# Patient Record
Sex: Female | Born: 1980 | Race: White | Hispanic: No | Smoking: Current every day smoker
Health system: Southern US, Community
[De-identification: ages and names within clinical notes are randomized; demographics above are authoritative.]

## PROBLEM LIST (undated history)

## (undated) DIAGNOSIS — M419 Scoliosis, unspecified: Secondary | ICD-10-CM

## (undated) DIAGNOSIS — F419 Anxiety disorder, unspecified: Secondary | ICD-10-CM

## (undated) DIAGNOSIS — N809 Endometriosis, unspecified: Secondary | ICD-10-CM

## (undated) DIAGNOSIS — Z8619 Personal history of other infectious and parasitic diseases: Secondary | ICD-10-CM

## (undated) DIAGNOSIS — I319 Disease of pericardium, unspecified: Secondary | ICD-10-CM

## (undated) DIAGNOSIS — F32A Depression, unspecified: Secondary | ICD-10-CM

## (undated) DIAGNOSIS — Z8711 Personal history of peptic ulcer disease: Secondary | ICD-10-CM

## (undated) HISTORY — DX: Personal history of other infectious and parasitic diseases: Z86.19

## (undated) HISTORY — DX: Endometriosis, unspecified: N80.9

## (undated) HISTORY — PX: CARDIAC CATHETERIZATION: SHX172

## (undated) HISTORY — PX: TUBAL LIGATION: SHX77

## (undated) HISTORY — DX: Disease of pericardium, unspecified: I31.9

## (undated) HISTORY — PX: ABDOMINAL HYSTERECTOMY: SHX81

---

## 2004-04-25 ENCOUNTER — Emergency Department (HOSPITAL_COMMUNITY): Admission: EM | Admit: 2004-04-25 | Discharge: 2004-04-26 | Payer: Self-pay | Admitting: *Deleted

## 2006-03-01 ENCOUNTER — Emergency Department (HOSPITAL_COMMUNITY): Admission: EM | Admit: 2006-03-01 | Discharge: 2006-03-01 | Payer: Self-pay | Admitting: Emergency Medicine

## 2008-02-16 ENCOUNTER — Emergency Department (HOSPITAL_COMMUNITY): Admission: EM | Admit: 2008-02-16 | Discharge: 2008-02-17 | Payer: Self-pay | Admitting: Emergency Medicine

## 2008-03-28 ENCOUNTER — Emergency Department (HOSPITAL_COMMUNITY): Admission: EM | Admit: 2008-03-28 | Discharge: 2008-03-28 | Payer: Self-pay | Admitting: Emergency Medicine

## 2009-01-23 ENCOUNTER — Emergency Department (HOSPITAL_COMMUNITY): Admission: EM | Admit: 2009-01-23 | Discharge: 2009-01-23 | Payer: Self-pay | Admitting: Emergency Medicine

## 2010-02-18 ENCOUNTER — Emergency Department (HOSPITAL_COMMUNITY): Admission: EM | Admit: 2010-02-18 | Discharge: 2010-02-18 | Payer: Self-pay | Admitting: Emergency Medicine

## 2010-02-19 ENCOUNTER — Ambulatory Visit (HOSPITAL_COMMUNITY): Admission: RE | Admit: 2010-02-19 | Discharge: 2010-02-19 | Payer: Self-pay | Admitting: Emergency Medicine

## 2010-04-20 ENCOUNTER — Emergency Department (HOSPITAL_COMMUNITY): Admission: EM | Admit: 2010-04-20 | Discharge: 2010-04-21 | Payer: Self-pay | Admitting: Emergency Medicine

## 2010-09-05 ENCOUNTER — Emergency Department (HOSPITAL_COMMUNITY): Admission: EM | Admit: 2010-09-05 | Discharge: 2010-09-06 | Payer: Self-pay | Admitting: Emergency Medicine

## 2010-09-06 ENCOUNTER — Emergency Department (HOSPITAL_COMMUNITY): Admission: EM | Admit: 2010-09-06 | Discharge: 2010-09-07 | Payer: Self-pay | Admitting: Emergency Medicine

## 2011-03-12 LAB — BASIC METABOLIC PANEL
BUN: 11 mg/dL (ref 6–23)
CO2: 23 mEq/L (ref 19–32)
Calcium: 8.7 mg/dL (ref 8.4–10.5)
GFR calc Af Amer: >60 mL/min (ref >60)
GFR calc non Af Amer: >60 mL/min (ref >60)
Glucose, Bld: 121 mg/dL — ABNORMAL HIGH (ref 70–99)
Sodium: 134 mEq/L — ABNORMAL LOW (ref 135–145)

## 2011-03-12 LAB — CBC
HCT: 36.4 % (ref 36.0–46.0)
Hemoglobin: 12.4 g/dL (ref 12.0–15.0)
MCH: 29.4 pg (ref 26.0–34.0)
MCHC: 34 g/dL (ref 30.0–36.0)
Platelets: 230 10*3/uL (ref 150–400)
RDW: 12.5 % (ref 11.5–15.5)

## 2011-03-12 LAB — URINE MICROSCOPIC-ADD ON

## 2011-03-12 LAB — URINALYSIS, ROUTINE W REFLEX MICROSCOPIC
Bilirubin Urine: NEGATIVE
Glucose, UA: NEGATIVE mg/dL
Ketones, ur: NEGATIVE mg/dL
Leukocytes, UA: NEGATIVE
Nitrite: NEGATIVE
Protein, ur: NEGATIVE mg/dL
Urobilinogen, UA: 0.2 mg/dL (ref 0.0–1.0)

## 2011-03-12 LAB — DIFFERENTIAL
Basophils Relative: 0 % (ref 0–1)
Lymphocytes Relative: 13 % (ref 12–46)
Lymphs Abs: 1.4 10*3/uL (ref 0.7–4.0)
Neutro Abs: 8.6 10*3/uL — ABNORMAL HIGH (ref 1.7–7.7)
Neutrophils Relative %: 80 % — ABNORMAL HIGH (ref 43–77)

## 2011-03-18 LAB — URINALYSIS, ROUTINE W REFLEX MICROSCOPIC
Leukocytes, UA: NEGATIVE
Nitrite: NEGATIVE
Protein, ur: NEGATIVE mg/dL
Urobilinogen, UA: 0.2 mg/dL (ref 0.0–1.0)

## 2011-03-18 LAB — URINE MICROSCOPIC-ADD ON

## 2011-03-18 LAB — WET PREP, GENITAL
Trich, Wet Prep: NONE SEEN
Yeast Wet Prep HPF POC: NONE SEEN

## 2011-03-18 LAB — PREGNANCY, URINE: Preg Test, Ur: NEGATIVE

## 2011-03-18 LAB — RPR: RPR Ser Ql: NONREACTIVE

## 2011-04-13 LAB — URINALYSIS, ROUTINE W REFLEX MICROSCOPIC
Ketones, ur: NEGATIVE mg/dL
Leukocytes, UA: NEGATIVE
Nitrite: NEGATIVE
Protein, ur: NEGATIVE mg/dL
Urobilinogen, UA: 0.2 mg/dL (ref 0.0–1.0)

## 2011-04-13 LAB — URINE MICROSCOPIC-ADD ON

## 2011-04-13 LAB — GC/CHLAMYDIA PROBE AMP, GENITAL
Chlamydia, DNA Probe: NEGATIVE
GC Probe Amp, Genital: NEGATIVE

## 2011-04-13 LAB — WET PREP, GENITAL

## 2011-04-13 LAB — RPR: RPR Ser Ql: NONREACTIVE

## 2011-06-13 ENCOUNTER — Emergency Department (HOSPITAL_COMMUNITY)
Admission: EM | Admit: 2011-06-13 | Discharge: 2011-06-13 | Disposition: A | Discharge status: Home / Self Care | Payer: Medicaid Other | Attending: Emergency Medicine | Admitting: Emergency Medicine

## 2011-06-13 ENCOUNTER — Emergency Department (HOSPITAL_COMMUNITY): Payer: Medicaid Other

## 2011-06-13 DIAGNOSIS — G43109 Migraine with aura, not intractable, without status migrainosus: Secondary | ICD-10-CM | POA: Insufficient documentation

## 2011-06-13 DIAGNOSIS — R112 Nausea with vomiting, unspecified: Secondary | ICD-10-CM | POA: Insufficient documentation

## 2011-06-13 DIAGNOSIS — R29898 Other symptoms and signs involving the musculoskeletal system: Secondary | ICD-10-CM | POA: Insufficient documentation

## 2011-06-13 DIAGNOSIS — R4701 Aphasia: Secondary | ICD-10-CM | POA: Insufficient documentation

## 2011-06-13 DIAGNOSIS — R079 Chest pain, unspecified: Secondary | ICD-10-CM | POA: Insufficient documentation

## 2011-06-13 LAB — COMPREHENSIVE METABOLIC PANEL
AST: 25 U/L (ref 0–37)
Albumin: 3.8 g/dL (ref 3.5–5.2)
BUN: 11 mg/dL (ref 6–23)
CO2: 23 mEq/L (ref 19–32)
Calcium: 8.8 mg/dL (ref 8.4–10.5)
Chloride: 103 mEq/L (ref 96–112)
Creatinine, Ser: 0.75 mg/dL (ref 0.50–1.10)
GFR calc non Af Amer: >60 mL/min (ref >60)
Total Bilirubin: 0.2 mg/dL — ABNORMAL LOW (ref 0.3–1.2)

## 2011-06-13 LAB — CBC
HCT: 36.9 % (ref 36.0–46.0)
MCH: 28.7 pg (ref 26.0–34.0)
MCV: 83.3 fL (ref 78.0–100.0)
Platelets: 269 10*3/uL (ref 150–400)
RDW: 12.5 % (ref 11.5–15.5)
WBC: 8.4 10*3/uL (ref 4.0–10.5)

## 2011-06-13 LAB — POCT I-STAT, CHEM 8
BUN: 13 mg/dL (ref 6–23)
Calcium, Ion: 1.1 mmol/L — ABNORMAL LOW (ref 1.12–1.32)
Chloride: 106 mEq/L (ref 96–112)
Creatinine, Ser: 0.8 mg/dL (ref 0.50–1.10)
Glucose, Bld: 103 mg/dL — ABNORMAL HIGH (ref 70–99)
Potassium: 4.4 mEq/L (ref 3.5–5.1)

## 2011-06-13 LAB — DIFFERENTIAL
Eosinophils Absolute: 0.1 10*3/uL (ref 0.0–0.7)
Eosinophils Relative: 1 % (ref 0–5)
Lymphocytes Relative: 23 % (ref 12–46)
Lymphs Abs: 1.9 10*3/uL (ref 0.7–4.0)
Monocytes Relative: 7 % (ref 3–12)

## 2011-06-13 LAB — CK TOTAL AND CKMB (NOT AT ARMC)
CK, MB: 1.7 ng/mL (ref 0.3–4.0)
Relative Index: 1.3 (ref 0.0–2.5)
Total CK: 132 U/L (ref 7–177)

## 2011-06-14 NOTE — Consult Note (Signed)
NAMESEARA, Desiree Pope               ACCOUNT NO.:  [id number]  MEDICAL RECORD NO.:  [id number]  LOCATION:  [facility]                         FACILITY:  [facility]  PHYSICIAN:  Levie Heritage, MD       DATE OF BIRTH:  April 16, 1981  DATE OF CONSULTATION:  06/13/2011 DATE OF DISCHARGE:  06/13/2011                                CONSULTATION   REFERRING PHYSICIAN:  ER Team.  REASON FOR CONSULTATION:  Code stroke.  CHIEF COMPLAINT:  Problem speaking and questionable right-sided weakness.  HISTORY OF PRESENT ILLNESS:  This patient is a 30 year old woman who was in her usual state of health around 10:30 a.m. this morning when husband called the EMS for chest pain originally.  When the EMS arrived there, she still had some degree of chest pain, however main complaint changed to problems with slowing of speaking and headaches and some questionable right-sided weakness that was defined by the EMS as partial weakness, but not fully weak on the right side. On my examination in the emergency department at around 1:20 p.m. today, her NIH stroke scale is 0.  I do see the speaking but she is just speaking a little slow.  There is no dysarthria or aphasia otherwise. CT scan of the head was performed in the emergency department and was unremarkable for any acute intracranial abnormality either.  PAST MEDICAL HISTORY:  The patient is known to have migraine headaches, spina bifida and scoliosis.  MEDICATIONS:  She uses p.r.n. basis of Excedrin for the migraine headaches.  ALLERGIES:  She is allergic to [facility].  SOCIAL HISTORY:  She is married, has 2 children.  Quit smoking a year ago.  Rarely use alcohol, but denies drug abuse.  FAMILY HISTORY:  Grandmother had diabetes mellitus, hypertension, and cardiac issues.  Mother has breast cancer.  Father has migraine and obstructive sleep apnea.  REVIEW OF CLINICAL DATA:  I have reviewed the patient's CT scan of the head performed today, it is unremarkable.   I have seen her stroke panel, it is unremarkable as well.  REVIEW OF SYSTEMS:  Currently, she admits to have headache but denies any chest pain and shortness of breath, although she states that she did have this early morning.  No recent fevers.  No recent rashes.  No recent weight loss.  She does admit to have "lot of stressors around me."  On further questioning, she admits to have the main issue because the husband is not home and works out of the town somewhere and she is homebound with 2 children. No bowel or bladder control problem.  No nausea, vomiting, diarrhea.  No fevers.  No burning urination.  PHYSICAL EXAMINATION:  Comfortably lying down on the bed now.  Vitals, 89 per minute pulse and blood pressure of 134/78 mmHg. She is awake, oriented x3 in mild distress of overall anxiety. She is speaking slowly and states that it is because of having severe headaches and she is trying to avoid her head to move, even the jaw hurts when she speaks louder normally. Both pupils are reactive to light and accommodation.  I do not see any field cut.  Moves eyes to all direction.  Face  is symmetrical for strength testing and sensation.  Tongue is midline without atrophy or fasciculation.  Palate elevates symmetrically bilaterally. Motor examination reveals strength 5/5 all over without a drift. Sensory examination is intact to light touch all over. Gait, I have seen her walking normally and casually from bed to the restroom in the ER. She is not aphasic and is not dysarthric either.  She is just speaking slowly and moving slowly in order to avoid the headache.  IMPRESSION:  A 30 year old woman with known long history of migraine, has no headache as well as questionable transient right-sided weakness that we did not notice or witnessed in the ER.  Her NIH stroke scale is zero currently and the CT scan of the head is unremarkable. My impression is that her symptomatology is the result of  her migraine headache and less likely because of a CNS ischemic event. The possibility of a tiny small stroke in such settings can never be completely ruled out however.  PLAN:  Please give the patient aspirin for now and perform the MRI of the brain. I will follow up on the MRI of the brain and will revisit the patient if the MRI shows an abnormality. As far as her chronic migraine headaches are concerned, I would suggest starting her on 10 mg of amitriptyline at bedtime daily basis for migraine prophylaxis.  This will also help her sleep well. I have discussed the details of my impression with Dr. Golda Acre of the ER and the patient herself at the bedside.  She seems to understand that.    ______________________________ Levie Heritage, MD     WS/MEDQ  D:  06/13/2011  T:  06/14/2011  Job:  [id number]  Electronically Signed by Levie Heritage MD on 06/14/2011 07:45:00 AM

## 2013-04-12 ENCOUNTER — Encounter: Payer: Self-pay | Admitting: *Deleted

## 2013-04-12 DIAGNOSIS — Z8619 Personal history of other infectious and parasitic diseases: Secondary | ICD-10-CM

## 2013-04-13 ENCOUNTER — Other Ambulatory Visit: Payer: Self-pay | Admitting: Adult Health

## 2016-07-04 ENCOUNTER — Encounter (HOSPITAL_COMMUNITY): Payer: Self-pay | Admitting: *Deleted

## 2016-07-04 ENCOUNTER — Emergency Department (HOSPITAL_COMMUNITY)
Admission: EM | Admit: 2016-07-04 | Discharge: 2016-07-04 | Disposition: A | Discharge status: Home / Self Care | Payer: BLUE CROSS/BLUE SHIELD | Attending: Emergency Medicine | Admitting: Emergency Medicine

## 2016-07-04 ENCOUNTER — Emergency Department (HOSPITAL_COMMUNITY): Payer: BLUE CROSS/BLUE SHIELD

## 2016-07-04 DIAGNOSIS — I252 Old myocardial infarction: Secondary | ICD-10-CM | POA: Insufficient documentation

## 2016-07-04 DIAGNOSIS — N838 Other noninflammatory disorders of ovary, fallopian tube and broad ligament: Secondary | ICD-10-CM

## 2016-07-04 DIAGNOSIS — F17200 Nicotine dependence, unspecified, uncomplicated: Secondary | ICD-10-CM | POA: Insufficient documentation

## 2016-07-04 DIAGNOSIS — N839 Noninflammatory disorder of ovary, fallopian tube and broad ligament, unspecified: Secondary | ICD-10-CM | POA: Insufficient documentation

## 2016-07-04 DIAGNOSIS — Z8673 Personal history of transient ischemic attack (TIA), and cerebral infarction without residual deficits: Secondary | ICD-10-CM | POA: Insufficient documentation

## 2016-07-04 DIAGNOSIS — N809 Endometriosis, unspecified: Secondary | ICD-10-CM

## 2016-07-04 DIAGNOSIS — R102 Pelvic and perineal pain: Secondary | ICD-10-CM

## 2016-07-04 DIAGNOSIS — N8003 Adenomyosis of the uterus: Secondary | ICD-10-CM

## 2016-07-04 DIAGNOSIS — N800 Endometriosis of uterus: Secondary | ICD-10-CM | POA: Insufficient documentation

## 2016-07-04 DIAGNOSIS — N946 Dysmenorrhea, unspecified: Secondary | ICD-10-CM | POA: Insufficient documentation

## 2016-07-04 DIAGNOSIS — Z79899 Other long term (current) drug therapy: Secondary | ICD-10-CM | POA: Diagnosis not present

## 2016-07-04 DIAGNOSIS — R1030 Lower abdominal pain, unspecified: Secondary | ICD-10-CM | POA: Diagnosis present

## 2016-07-04 LAB — CBC WITH DIFFERENTIAL/PLATELET
BASOS ABS: 0 10*3/uL (ref 0.0–0.1)
BASOS PCT: 0 %
Eosinophils Absolute: 0.2 10*3/uL (ref 0.0–0.7)
Eosinophils Relative: 2 %
HEMATOCRIT: 37.3 % (ref 36.0–46.0)
HEMOGLOBIN: 12.4 g/dL (ref 12.0–15.0)
Lymphocytes Relative: 21 %
Lymphs Abs: 1.7 10*3/uL (ref 0.7–4.0)
MCH: 29.1 pg (ref 26.0–34.0)
MCHC: 33.2 g/dL (ref 30.0–36.0)
MCV: 87.6 fL (ref 78.0–100.0)
Monocytes Absolute: 0.6 10*3/uL (ref 0.1–1.0)
Monocytes Relative: 7 %
NEUTROS ABS: 5.7 10*3/uL (ref 1.7–7.7)
NEUTROS PCT: 70 %
PLATELETS: 262 10*3/uL (ref 150–400)
RBC: 4.26 MIL/uL (ref 3.87–5.11)
RDW: 13.4 % (ref 11.5–15.5)
WBC: 8.2 10*3/uL (ref 4.0–10.5)

## 2016-07-04 LAB — POC URINE PREG, ED: Preg Test, Ur: NEGATIVE

## 2016-07-04 LAB — URINE MICROSCOPIC-ADD ON

## 2016-07-04 LAB — BASIC METABOLIC PANEL
Anion gap: 4 — ABNORMAL LOW (ref 5–15)
BUN: 14 mg/dL (ref 6–20)
CALCIUM: 8.5 mg/dL — AB (ref 8.9–10.3)
CO2: 24 mmol/L (ref 22–32)
CREATININE: 0.8 mg/dL (ref 0.44–1.00)
Chloride: 108 mmol/L (ref 101–111)
GFR calc non Af Amer: >60 mL/min (ref >60)
Glucose, Bld: 107 mg/dL — ABNORMAL HIGH (ref 65–99)
Potassium: 3.8 mmol/L (ref 3.5–5.1)
SODIUM: 136 mmol/L (ref 135–145)

## 2016-07-04 LAB — URINALYSIS, ROUTINE W REFLEX MICROSCOPIC
Bilirubin Urine: NEGATIVE
GLUCOSE, UA: NEGATIVE mg/dL
KETONES UR: NEGATIVE mg/dL
Leukocytes, UA: NEGATIVE
Nitrite: NEGATIVE
PROTEIN: NEGATIVE mg/dL
Specific Gravity, Urine: 1.02 (ref 1.005–1.030)
pH: 7 (ref 5.0–8.0)

## 2016-07-04 LAB — WET PREP, GENITAL
Clue Cells Wet Prep HPF POC: NONE SEEN
Sperm: NONE SEEN
TRICH WET PREP: NONE SEEN
YEAST WET PREP: NONE SEEN

## 2016-07-04 MED ORDER — DIPHENHYDRAMINE HCL 50 MG/ML IJ SOLN
INTRAMUSCULAR | Status: AC
  Administered 2016-07-04: 25 mg via INTRAVENOUS
  Filled 2016-07-04: qty 1

## 2016-07-04 MED ORDER — OXYCODONE-ACETAMINOPHEN 5-325 MG PO TABS: 1.0000 | ORAL_TABLET | ORAL | Status: DC | PRN

## 2016-07-04 MED ORDER — SODIUM CHLORIDE 0.9 % IV BOLUS (SEPSIS)
1000.0000 mL | Freq: Once | INTRAVENOUS | Status: AC
  Administered 2016-07-04: 1000 mL via INTRAVENOUS

## 2016-07-04 MED ORDER — DIPHENHYDRAMINE HCL 50 MG/ML IJ SOLN
25.0000 mg | Freq: Once | INTRAMUSCULAR | Status: AC
  Administered 2016-07-04: 25 mg via INTRAVENOUS

## 2016-07-04 MED ORDER — ONDANSETRON HCL 4 MG/2ML IJ SOLN
4.0000 mg | Freq: Once | INTRAMUSCULAR | Status: AC
  Administered 2016-07-04: 4 mg via INTRAVENOUS
  Filled 2016-07-04: qty 2

## 2016-07-04 MED ORDER — MORPHINE SULFATE (PF) 4 MG/ML IV SOLN
4.0000 mg | Freq: Once | INTRAVENOUS | Status: AC
  Administered 2016-07-04: 4 mg via INTRAVENOUS
  Filled 2016-07-04: qty 1

## 2016-07-04 MED ORDER — DIPHENHYDRAMINE HCL 12.5 MG/5ML PO ELIX: 25.0000 mg | ORAL_SOLUTION | Freq: Once | ORAL | Status: DC

## 2016-07-04 MED ORDER — MORPHINE SULFATE (PF) 4 MG/ML IV SOLN
4.0000 mg | Freq: Once | INTRAVENOUS | Status: AC
Start: 2016-07-04 — End: 2016-07-04
  Administered 2016-07-04: 4 mg via INTRAVENOUS
  Filled 2016-07-04: qty 1

## 2016-07-04 MED ORDER — PROMETHAZINE HCL 25 MG PO TABS: 25.0000 mg | ORAL_TABLET | Freq: Four times a day (QID) | ORAL | Status: DC | PRN

## 2016-07-04 NOTE — ED Provider Notes (Signed)
CSN: [id number]     Arrival date & time 07/04/16  0359 History   First MD Initiated Contact with Patient 07/04/16 [phone]     Chief Complaint  Patient presents with  . Abdominal Pain     (Consider location/radiation/quality/duration/timing/severity/associated sxs/prior Treatment) Patient is a 35 y.o. female presenting with abdominal pain. The history is provided by the patient.  Abdominal Pain She states her menses started yesterday and have been associated with some suprapubic cramping which is more severe than normal. She rates pain at 10/10. Menstrual flows also been slightly heavier than normal. Menses started at about the expected time. She is status post tubal ligation. She also relates an episode of severe pelvic pain about 2 weeks ago. She tried taking Midol without relief. There is associated nausea but she has not vomited.  Past Medical History  Diagnosis Date  . Hx of chlamydia infection   . MI (myocardial infarction) ([place])   . Stroke [facility])    Past Surgical History  Procedure Laterality Date  . Tubal ligation    . Cardiac surgery     Family History  Problem Relation Age of Onset  . Hypertension Other   . Cancer Other   . Diabetes Other   . Thyroid disease Other    Social History  Substance Use Topics  . Smoking status: Current Every Day Smoker  . Smokeless tobacco: None  . Alcohol Use: Yes   OB History    No data available     Review of Systems  Gastrointestinal: Positive for abdominal pain.  All other systems reviewed and are negative.     Allergies  Motrin  Home Medications   Prior to Admission medications   Medication Sig Start Date End Date Taking? Authorizing Provider  Acetaminophen-Caff-Pyrilamine (MIDOL COMPLETE PO) Take by mouth.   Yes Historical Provider, MD   BP 119/94 mmHg  Pulse 92  Temp(Src) 98.2 F (36.8 C) (Oral)  Resp 20  Ht 5\' 4"  (1.626 m)  Wt 120 lb (54.432 kg)  BMI 20.59 kg/m2  SpO2 100%  LMP 07/03/2016 Physical Exam   Genitourinary:  Pelvic: Normal external female genitalia. Small amount of blood present in the vaginal vault. Cervix is closed. Moderate tenderness all with motion of the cervix. Moderate bilateral adnexal tenderness. Fundus normal size and position and tender diffusely.  Nursing note and vitals reviewed.  35 year old female,  who appears to be in pain, but isin no acute distress. Vital signs are  normal. Oxygen saturation is 100%, which is normal. Head is normocephalic and atraumatic. PERRLA, EOMI. Oropharynx is clear. Neck is nontender and supple without adenopathy or JVD. Back is nontender and there is no CVA tenderness. Lungs are clear without rales, wheezes, or rhonchi. Chest is nontender. Heart has regular rate and rhythm without murmur. Abdomen is soft, flat,  with moderate suprapubic tenderness. There is no rebound or guarding. There are nomasses or hepatosplenomegaly and peristalsis is hypoactive. Extremities have no cyanosis or edema, full range of motion is present. Skin is warm and dry without rash. Neurologic: Mental status is normal, cranial nerves are intact, there are no motor or sensory deficits.  ED Course  Procedures (including critical care time) Labs Review Results for orders placed or performed during the hospital encounter of 07/04/16  Wet prep, genital  Result Value Ref Range   Yeast Wet Prep HPF POC NONE SEEN NONE SEEN   Trich, Wet Prep NONE SEEN NONE SEEN   Clue Cells Wet Prep HPF POC  NONE SEEN NONE SEEN   WBC, Wet Prep HPF POC MODERATE (A) NONE SEEN   Sperm NONE SEEN   CBC with Differential  Result Value Ref Range   WBC 8.2 4.0 - 10.5 K/uL   RBC 4.26 3.87 - 5.11 MIL/uL   Hemoglobin 12.4 12.0 - 15.0 g/dL   HCT 37.3 36.0 - 46.0 %   MCV 87.6 78.0 - 100.0 fL   MCH 29.1 26.0 - 34.0 pg   MCHC 33.2 30.0 - 36.0 g/dL   RDW 13.4 11.5 - 15.5 %   Platelets 262 150 - 400 K/uL   Neutrophils Relative % 70 %   Neutro Abs 5.7 1.7 - 7.7 K/uL   Lymphocytes Relative  21 %   Lymphs Abs 1.7 0.7 - 4.0 K/uL   Monocytes Relative 7 %   Monocytes Absolute 0.6 0.1 - 1.0 K/uL   Eosinophils Relative 2 %   Eosinophils Absolute 0.2 0.0 - 0.7 K/uL   Basophils Relative 0 %   Basophils Absolute 0.0 0.0 - 0.1 K/uL  Urinalysis, Routine w reflex microscopic  Result Value Ref Range   Color, Urine YELLOW YELLOW   APPearance HAZY (A) CLEAR   Specific Gravity, Urine 1.020 1.005 - 1.030   pH 7.0 5.0 - 8.0   Glucose, UA NEGATIVE NEGATIVE mg/dL   Hgb urine dipstick MODERATE (A) NEGATIVE   Bilirubin Urine NEGATIVE NEGATIVE   Ketones, ur NEGATIVE NEGATIVE mg/dL   Protein, ur NEGATIVE NEGATIVE mg/dL   Nitrite NEGATIVE NEGATIVE   Leukocytes, UA NEGATIVE NEGATIVE  Basic metabolic panel  Result Value Ref Range   Sodium 136 135 - 145 mmol/L   Potassium 3.8 3.5 - 5.1 mmol/L   Chloride 108 101 - 111 mmol/L   CO2 24 22 - 32 mmol/L   Glucose, Bld 107 (H) 65 - 99 mg/dL   BUN 14 6 - 20 mg/dL   Creatinine, Ser 0.80 0.44 - 1.00 mg/dL   Calcium 8.5 (L) 8.9 - 10.3 mg/dL   GFR calc non Af Amer >60 >60 mL/min   GFR calc Af Amer >60 >60 mL/min   Anion gap 4 (L) 5 - 15  Urine microscopic-add on  Result Value Ref Range   Squamous Epithelial / LPF 0-5 (A) NONE SEEN   WBC, UA 0-5 0 - 5 WBC/hpf   RBC / HPF 6-30 0 - 5 RBC/hpf   Bacteria, UA FEW (A) NONE SEEN   Urine-Other AMORPHOUS URATES/PHOSPHATES   POC urine preg, ED  Result Value Ref Range   Preg Test, Ur NEGATIVE NEGATIVE   I have personally reviewed and evaluated these images and lab results as part of my medical decision-making.   MDM   Final diagnoses:  None   Painful menstrual cramps. Old records are reviewed and she had been evaluated for similar complaints several years ago MRI scan. She is intolerant of NSAIDs so will give morphine for pain and ondansetron for nausea.  Only partial relief with morphine and morphine is repeated. Will send for pelvic ultrasound. Case is signed out to Dr. Lacinda Axon to evaluate ultrasound  results.  Delora Fuel, MD [phone]

## 2016-07-04 NOTE — ED Notes (Signed)
Pt updated on care 

## 2016-07-04 NOTE — ED Notes (Signed)
Pt states abdominal cramping. Took midol around 0215 w/ no relief.

## 2016-07-04 NOTE — ED Notes (Signed)
Called radiology to see status of [place], [place] tech will be in at 9:30. Pt updated.

## 2016-07-04 NOTE — ED Notes (Signed)
Pt arm turning red after slow IV push administration of morphine.  Pt reports this has happened to her before and that benedryl has corrected problem in the past.  Pt airway intact. Dr. Lacinda Axon notified.

## 2016-07-04 NOTE — ED Notes (Signed)
Attempted to call US with no answer.

## 2016-07-04 NOTE — ED Notes (Signed)
Patient with no complaints at this time. Respirations even and unlabored. Skin warm/dry. Discharge instructions reviewed with patient at this time. Patient given opportunity to voice concerns/ask questions. IV removed per policy and band-aid applied to site. Patient discharged at this time and left Emergency Department with steady gait.  

## 2016-07-04 NOTE — ED Notes (Signed)
Spoke to Dr. Lacinda Axon concerning pt Desiree Pope. Family/pt wanting to know results.

## 2016-07-04 NOTE — ED Notes (Signed)
Dr. Lacinda Axon notified that pt is wanting pain medication.

## 2016-07-04 NOTE — ED Notes (Addendum)
Pt taken to [place] by rich and kiarah going to [place].

## 2016-07-04 NOTE — ED Notes (Signed)
Pt ambulated to bathroom 

## 2016-07-04 NOTE — ED Provider Notes (Signed)
Discussed test results with patient, her husband, her mother. Also discussed with Dr. Elonda Husky who will evaluate her left ovarian mass. Patient understands that this may be cancer.  CA 125 pending. Patient will call the office on Monday for a follow-up appointment.  Nat Christen, MD 07/04/16 1343

## 2016-07-04 NOTE — Discharge Instructions (Signed)
Dysmenorrhea °Menstrual cramps (dysmenorrhea) are caused by the muscles of the uterus tightening (contracting) during a menstrual period. For some women, this discomfort is merely bothersome. For others, dysmenorrhea can be severe enough to interfere with everyday activities for a few days each month. °Primary dysmenorrhea is menstrual cramps that last a couple of days when you start having menstrual periods or soon after. This often begins after a teenager starts having her period. As a woman gets older or has a baby, the cramps will usually lessen or disappear. Secondary dysmenorrhea begins later in life, lasts longer, and the pain may be stronger than primary dysmenorrhea. The pain may start before the period and last a few days after the period.  °CAUSES  °Dysmenorrhea is usually caused by an underlying problem, such as: °· The tissue lining the uterus grows outside of the uterus in other areas of the body (endometriosis). °· The endometrial tissue, which normally lines the uterus, is found in or grows into the muscular walls of the uterus (adenomyosis). °· The pelvic blood vessels are engorged with blood just before the menstrual period (pelvic congestive syndrome). °· Overgrowth of cells (polyps) in the lining of the uterus or cervix. °· Falling down of the uterus (prolapse) because of loose or stretched ligaments. °· Depression. °· Bladder problems, infection, or inflammation. °· Problems with the intestine, a tumor, or irritable bowel syndrome. °· Cancer of the female organs or bladder. °· A severely tipped uterus. °· A very tight opening or closed cervix. °· Noncancerous tumors of the uterus (fibroids). °· Pelvic inflammatory disease (PID). °· Pelvic scarring (adhesions) from a previous surgery. °· Ovarian cyst. °· An intrauterine device (IUD) used for birth control. °RISK FACTORS °You may be at greater risk of dysmenorrhea if: °· You are younger than age 30. °· You started puberty early. °· You have  irregular or heavy bleeding. °· You have never given birth. °· You have a family history of this problem. °· You are a smoker. °SIGNS AND SYMPTOMS  °· Cramping or throbbing pain in your lower abdomen. °· Headaches. °· Lower back pain. °· Nausea or vomiting. °· Diarrhea. °· Sweating or dizziness. °· Loose stools. °DIAGNOSIS  °A diagnosis is based on your history, symptoms, physical exam, diagnostic tests, or procedures. Diagnostic tests or procedures may include: °· Blood tests. °· Ultrasonography. °· An examination of the lining of the uterus (dilation and curettage, D&C). °· An examination inside your abdomen or pelvis with a scope (laparoscopy). °· X-rays. °· CT scan. °· MRI. °· An examination inside the bladder with a scope (cystoscopy). °· An examination inside the intestine or stomach with a scope (colonoscopy, gastroscopy). °TREATMENT  °Treatment depends on the cause of the dysmenorrhea. Treatment may include: °· Pain medicine prescribed by your health care provider. °· Birth control pills or an IUD with progesterone hormone in it. °· Hormone replacement therapy. °· Nonsteroidal anti-inflammatory drugs (NSAIDs). These may help stop the production of prostaglandins. °· Surgery to remove adhesions, endometriosis, ovarian cyst, or fibroids. °· Removal of the uterus (hysterectomy). °· Progesterone shots to stop the menstrual period. °· Cutting the nerves on the sacrum that go to the female organs (presacral neurectomy). °· Electric current to the sacral nerves (sacral nerve stimulation). °· Antidepressant medicine. °· Psychiatric therapy, counseling, or group therapy. °· Exercise and physical therapy. °· Meditation and yoga therapy. °· Acupuncture. °HOME CARE INSTRUCTIONS  °· Only take over-the-counter or prescription medicines as directed by your health care provider. °· Place a heating pad   or hot water bottle on your lower back or abdomen. Do not sleep with the heating pad.  Use aerobic exercises, walking,  swimming, biking, and other exercises to help lessen the cramping.  Massage to the lower back or abdomen may help.  Stop smoking.  Avoid alcohol and caffeine. SEEK MEDICAL CARE IF:   Your pain does not get better with medicine.  You have pain with sexual intercourse.  Your pain increases and is not controlled with medicines.  You have abnormal vaginal bleeding with your period.  You develop nausea or vomiting with your period that is not controlled with medicine. SEEK IMMEDIATE MEDICAL CARE IF:  You pass out.    This information is not intended to replace advice given to you by your health care provider. Make sure you discuss any questions you have with your health care provider.   Document Released: 12/14/2005 Document Revised: 08/16/2013 Document Reviewed: 06/01/2013 [employer] Interactive Patient Education 2016 [employer]  Acetaminophen; Oxycodone tablets What is this medicine? ACETAMINOPHEN; OXYCODONE (a set a MEE noe fen; ox i KOE done) is a pain reliever. It is used to treat moderate to severe pain. This medicine may be used for other purposes; ask your health care provider or pharmacist if you have questions. What should I tell my health care provider before I take this medicine? They need to know if you have any of these conditions: -brain tumor -Crohn's disease, inflammatory bowel disease, or ulcerative colitis -drug abuse or addiction -head injury -heart or circulation problems -if you often drink alcohol -kidney disease or problems going to the bathroom -liver disease -lung disease, asthma, or breathing problems -an unusual or allergic reaction to acetaminophen, oxycodone, other opioid analgesics, other medicines, foods, dyes, or preservatives -pregnant or trying to get pregnant -breast-feeding How should I use this medicine? Take this medicine by mouth with a full glass of water. Follow the directions on the prescription label. You can take it with or  without food. If it upsets your stomach, take it with food. Take your medicine at regular intervals. Do not take it more often than directed. Talk to your pediatrician regarding the use of this medicine in children. Special care may be needed. Patients over 65 years old may have a stronger reaction and need a smaller dose. Overdosage: If you think you have taken too much of this medicine contact a poison control center or emergency room at once. NOTE: This medicine is only for you. Do not share this medicine with others. What if I miss a dose? If you miss a dose, take it as soon as you can. If it is almost time for your next dose, take only that dose. Do not take double or extra doses. What may interact with this medicine? -alcohol -antihistamines -barbiturates like amobarbital, butalbital, butabarbital, methohexital, pentobarbital, phenobarbital, thiopental, and secobarbital -benztropine -drugs for bladder problems like solifenacin, trospium, oxybutynin, tolterodine, hyoscyamine, and methscopolamine -drugs for breathing problems like ipratropium and tiotropium -drugs for certain stomach or intestine problems like propantheline, homatropine methylbromide, glycopyrrolate, atropine, belladonna, and dicyclomine -general anesthetics like etomidate, ketamine, nitrous oxide, propofol, desflurane, enflurane, halothane, isoflurane, and sevoflurane -medicines for depression, anxiety, or psychotic disturbances -medicines for sleep -muscle relaxants -naltrexone -narcotic medicines (opiates) for pain -phenothiazines like perphenazine, thioridazine, chlorpromazine, mesoridazine, fluphenazine, prochlorperazine, promazine, and trifluoperazine -scopolamine -tramadol -trihexyphenidyl This list may not describe all possible interactions. Give your health care provider a list of all the medicines, herbs, non-prescription drugs, or dietary supplements you use. Also tell them  if you smoke, drink alcohol, or use  illegal drugs. Some items may interact with your medicine. What should I watch for while using this medicine? Tell your doctor or health care professional if your pain does not go away, if it gets worse, or if you have new or a different type of pain. You may develop tolerance to the medicine. Tolerance means that you will need a higher dose of the medication for pain relief. Tolerance is normal and is expected if you take this medicine for a long time. Do not suddenly stop taking your medicine because you may develop a severe reaction. Your body becomes used to the medicine. This does NOT mean you are addicted. Addiction is a behavior related to getting and using a drug for a non-medical reason. If you have pain, you have a medical reason to take pain medicine. Your doctor will tell you how much medicine to take. If your doctor wants you to stop the medicine, the dose will be slowly lowered over time to avoid any side effects. You may get drowsy or dizzy. Do not drive, use machinery, or do anything that needs mental alertness until you know how this medicine affects you. Do not stand or sit up quickly, especially if you are an older patient. This reduces the risk of dizzy or fainting spells. Alcohol may interfere with the effect of this medicine. Avoid alcoholic drinks. There are different types of narcotic medicines (opiates) for pain. If you take more than one type at the same time, you may have more side effects. Give your health care provider a list of all medicines you use. Your doctor will tell you how much medicine to take. Do not take more medicine than directed. Call emergency for help if you have problems breathing. The medicine will cause constipation. Try to have a bowel movement at least every 2 to 3 days. If you do not have a bowel movement for 3 days, call your doctor or health care professional. Do not take Tylenol (acetaminophen) or medicines that have acetaminophen with this medicine. Too  much acetaminophen can be very dangerous. Many nonprescription medicines contain acetaminophen. Always read the labels carefully to avoid taking more acetaminophen. What side effects may I notice from receiving this medicine? Side effects that you should report to your doctor or health care professional as soon as possible: -allergic reactions like skin rash, itching or hives, swelling of the face, lips, or tongue -breathing difficulties, wheezing -confusion -light headedness or fainting spells -severe stomach pain -unusually weak or tired -yellowing of the skin or the whites of the eyes Side effects that usually do not require medical attention (report to your doctor or health care professional if they continue or are bothersome): -dizziness -drowsiness -nausea -vomiting This list may not describe all possible side effects. Call your doctor for medical advice about side effects. You may report side effects to FDA at 1-800-FDA-1088. Where should I keep my medicine? Keep out of the reach of children. This medicine can be abused. Keep your medicine in a safe place to protect it from theft. Do not share this medicine with anyone. Selling or giving away this medicine is dangerous and against the law. This medicine may cause accidental overdose and death if it taken by other adults, children, or pets. Mix any unused medicine with a substance like cat litter or coffee grounds. Then throw the medicine away in a sealed container like a sealed bag or a coffee can with a lid. Do  not use the medicine after the expiration date. Store at room temperature between 20 and 25 degrees C (68 and 77 degrees F). NOTE: This sheet is a summary. It may not cover all possible information. If you have questions about this medicine, talk to your doctor, pharmacist, or health care provider.    2016, [employer]. (2014-11-14 15:18:46)  Dysmenorrhea Dysmenorrhea is pain during a menstrual period. You will  have pain in the lower belly (abdomen). The pain is caused by the tightening (contracting) of the muscles of the uterus. The pain can be minor or severe. Headache, feeling sick to your stomach (nausea), throwing up (vomiting), or low back pain may occur with this condition. HOME CARE  Only take medicine as told by your doctor.  Place a heating pad or hot water bottle on your lower back or belly. Do not sleep with a heating pad.  Exercise may help lessen the pain.  Massage the lower back or belly.  Stop smoking.  Avoid alcohol and caffeine. GET HELP IF:   Your pain does not get better with medicine.  You have pain during sex.  Your pain gets worse while taking pain medicine.  Your period bleeding is heavier than normal.  You keep feeling sick to your stomach or keep throwing up. GET HELP RIGHT AWAY IF: You pass out (faint).   This information is not intended to replace advice given to you by your health care provider. Make sure you discuss any questions you have with your health care provider.   Document Released: 03/12/2009 Document Revised: 12/19/2013 Document Reviewed: 06/01/2013 Elsevier Interactive Patient Education 2016 Cable have a mass in your left ovary. This will need follow-up. Additionally, the muscular tissue of the uterus is inflamed. I discussed your case with Dr. Elonda Husky.  Please call the office Monday morning for a follow-up appointment next week. Prescription for pain and nausea medicine.

## 2016-07-05 LAB — HIV ANTIBODY (ROUTINE TESTING W REFLEX): HIV Screen 4th Generation wRfx: NONREACTIVE

## 2016-07-05 LAB — RPR: RPR Ser Ql: NONREACTIVE

## 2016-07-05 LAB — CA 125: CA 125: 43.4 U/mL — ABNORMAL HIGH (ref 0.0–38.1)

## 2016-07-06 LAB — GC/CHLAMYDIA PROBE AMP (~~LOC~~) NOT AT ARMC
Chlamydia: NEGATIVE
Neisseria Gonorrhea: NEGATIVE

## 2016-07-07 ENCOUNTER — Ambulatory Visit (INDEPENDENT_AMBULATORY_CARE_PROVIDER_SITE_OTHER): Payer: BLUE CROSS/BLUE SHIELD | Admitting: Obstetrics & Gynecology

## 2016-07-07 ENCOUNTER — Encounter: Payer: Self-pay | Admitting: Obstetrics & Gynecology

## 2016-07-07 VITALS — BP 110/70 | HR 60 | Ht 64.0 in | Wt 125.0 lb

## 2016-07-07 DIAGNOSIS — N839 Noninflammatory disorder of ovary, fallopian tube and broad ligament, unspecified: Secondary | ICD-10-CM

## 2016-07-07 DIAGNOSIS — N946 Dysmenorrhea, unspecified: Secondary | ICD-10-CM

## 2016-07-07 DIAGNOSIS — N941 Unspecified dyspareunia: Secondary | ICD-10-CM | POA: Diagnosis not present

## 2016-07-07 DIAGNOSIS — N838 Other noninflammatory disorders of ovary, fallopian tube and broad ligament: Secondary | ICD-10-CM

## 2016-07-07 NOTE — Progress Notes (Signed)
Patient Desiree Pope:[id number] A Noon, femaleDOB:06-22-1981, 18 y.ML:[id number] Preoperative History and Physical  Nour A Lewisis a 35 y.o.No obstetric history on file. with Patient's last menstrual period was 07/03/2016.admitted for a laparoscopic removal of an enlarging left ovarian mass, elective salpingectomy for ovarian cancer prophylaxis and a vaginal hysterectomy due to dysmenorrhea and bump dyspareunia, 100%.  Pt has had long standing dysmenorrhea and dyspareunia, unresponsive to hormonal control  She also has a persistent left ovarian mass which has had interval enlargement on scans with minimally elevated CA 125 of 43.4 She has had a tubal ligation in the past She desires to preserve her right ovary  PMH:       Past Medical History  Diagnosis Date  . Hx of chlamydia infection   . Pericarditis     age 31    PSH:       Past Surgical History  Procedure Laterality Date  . Tubal ligation    . Cardiac surgery      POb/GynH:     OB History   No data available      SH:     Social History  Substance Use Topics  . Smoking status: Current Every Day Smoker  . Smokeless tobacco: None  . Alcohol Use: Yes    FH:       Family History  Problem Relation Age of Onset  . Hypertension Other   . Cancer Other   . Diabetes Other   . Thyroid disease Other      Allergies:     Allergies  Allergen Reactions  . Iodine Anaphylaxis  . Motrin [Ibuprofen]     Medications:  Current outpatient prescriptions:  .loratadine (CLARITIN) 10 MG tablet, Take 10 mg by mouth daily as needed for allergies., Disp: , Rfl:  .oxyCODONE-acetaminophen (PERCOCET) 5-325 MG tablet, Take 1-2 tablets by mouth every 4 (four) hours as needed., Disp: 20 tablet, Rfl: 0 .oxyCODONE-acetaminophen (PERCOCET/ROXICET) 5-325 MG tablet, Take 1 tablet by mouth every 4 (four) hours as needed., Disp: 15 tablet, Rfl:  0 .promethazine (PHENERGAN) 25 MG tablet, Take 1 tablet (25 mg total) by mouth every 6 (six) hours as needed., Disp: 10 tablet, Rfl: 0 .Acetaminophen-Caff-Pyrilamine (MIDOL COMPLETE PO), Take 1 tablet by mouth daily as needed (pain). Reported on 07/07/2016, Disp: , Rfl:   Review of Systems:  Review of Systems  Constitutional: Negative for fever, chills, weight loss, malaise/fatigue and diaphoresis.  HENT: Negative for hearing loss, ear pain, nosebleeds, congestion, sore throat, neck pain, tinnitus and ear discharge.  Eyes: Negative for blurred vision, double vision, photophobia, pain, discharge and redness.  Respiratory: Negative for cough, hemoptysis, sputum production, shortness of breath, wheezing and stridor.  Cardiovascular: Negative for chest pain, palpitations, orthopnea, claudication, leg swelling and PND.  Gastrointestinal: Positive for abdominal pain. Negative for heartburn, nausea, vomiting, diarrhea, constipation, blood in stool and melena.  Genitourinary: Negative for dysuria, urgency, frequency, hematuria and flank pain.  Musculoskeletal: Negative for myalgias, back pain, joint pain and falls.  Skin: Negative for itching and rash.  Neurological: Negative for dizziness, tingling, tremors, sensory change, speech change, focal weakness, seizures, loss of consciousness, weakness and headaches.  Endo/Heme/Allergies: Negative for environmental allergies and polydipsia. Does not bruise/bleed easily.  Psychiatric/Behavioral: Negative for depression, suicidal ideas, hallucinations, memory loss and substance abuse. The patient is not nervous/anxious and does not have insomnia.     PHYSICAL EXAM:  Blood pressure 110/70, pulse 60, height 5\' 4"  (1.626 m), weight 125 lb (56.7 kg), last menstrual period 07/03/2016.   Vitals  reviewed. Constitutional: She is oriented to person, place, and time. She appears well-developed and well-nourished.  HENT:  Head: Normocephalic and  atraumatic.  Right Ear: External ear normal.  Left Ear: External ear normal.  Nose: Nose normal.  Mouth/Throat: Oropharynx is clear and moist.  Eyes: Conjunctivae and EOM are normal. Pupils are equal, round, and reactive to light. Right eye exhibits no discharge. Left eye exhibits no discharge. No scleral icterus.  Neck: Normal range of motion. Neck supple. No tracheal deviation present. No thyromegaly present.  Cardiovascular: Normal rate, regular rhythm, normal heart sounds and intact distal pulses. Exam reveals no gallop and no friction rub.  No murmur heard. Respiratory: Effort normal and breath sounds normal. No respiratory distress. She has no wheezes. She has no rales. She exhibits no tenderness.  GI: Soft. Bowel sounds are normal. She exhibits no distension and no mass. There is tenderness. There is no rebound and no guarding.  Genitourinary:  Vulva is normal without lesions Vagina is pink moist without discharge Cervix normal in appearance and pap is normal Uterus is normal size shape with tenderness on examto cervical movement and uterine palpation Adnexa is per sonogram tender Musculoskeletal: Normal range of motion. She exhibits no edema and no tenderness.  Neurological: She is alert and oriented to person, place, and time. She has normal reflexes. She displays normal reflexes. No cranial nerve deficit. She exhibits normal muscle tone. Coordination normal.  Skin: Skin is warm and dry. No rash noted. No erythema. No pallor.  Psychiatric: She has a normal mood and affect. Her behavior is normal. Judgment and thought content normal.    Labs:      Results for orders placed or performed during the hospital encounter of 07/04/16 (from the past 336 hour(s))  GC/Chlamydia probe amp   Collection Time: 07/04/16 12:00 AM  Result Value Ref Range   Chlamydia Negative    Neisseria gonorrhea Negative   CBC with Differential   Collection Time: 07/04/16 4:50 AM  Result  Value Ref Range   WBC 8.2 4.0 - 10.5 K/uL   RBC 4.26 3.87 - 5.11 MIL/uL   Hemoglobin 12.4 12.0 - 15.0 g/dL   HCT 37.3 36.0 - 46.0 %   MCV 87.6 78.0 - 100.0 fL   MCH 29.1 26.0 - 34.0 pg   MCHC 33.2 30.0 - 36.0 g/dL   RDW 13.4 11.5 - 15.5 %   Platelets 262 150 - 400 K/uL   Neutrophils Relative % 70 %   Neutro Abs 5.7 1.7 - 7.7 K/uL   Lymphocytes Relative 21 %   Lymphs Abs 1.7 0.7 - 4.0 K/uL   Monocytes Relative 7 %   Monocytes Absolute 0.6 0.1 - 1.0 K/uL   Eosinophils Relative 2 %   Eosinophils Absolute 0.2 0.0 - 0.7 K/uL   Basophils Relative 0 %   Basophils Absolute 0.0 0.0 - 0.1 K/uL  Urinalysis, Routine w reflex microscopic   Collection Time: 07/04/16 4:50 AM  Result Value Ref Range   Color, Urine YELLOW YELLOW   APPearance HAZY (A) CLEAR   Specific Gravity, Urine 1.020 1.005 - 1.030   pH 7.0 5.0 - 8.0   Glucose, UA NEGATIVE NEGATIVE mg/dL   Hgb urine dipstick MODERATE (A) NEGATIVE   Bilirubin Urine NEGATIVE NEGATIVE   Ketones, ur NEGATIVE NEGATIVE mg/dL   Protein, ur NEGATIVE NEGATIVE mg/dL   Nitrite NEGATIVE NEGATIVE   Leukocytes, UA NEGATIVE NEGATIVE  Basic metabolic panel   Collection Time: 07/04/16 4:50 AM  Result Value  Ref Range   Sodium 136 135 - 145 mmol/L   Potassium 3.8 3.5 - 5.1 mmol/L   Chloride 108 101 - 111 mmol/L   CO2 24 22 - 32 mmol/L   Glucose, Bld 107 (H) 65 - 99 mg/dL   BUN 14 6 - 20 mg/dL   Creatinine, Ser 0.80 0.44 - 1.00 mg/dL   Calcium 8.5 (L) 8.9 - 10.3 mg/dL   GFR calc non Af Amer >60 >60 mL/min   GFR calc Af Amer >60 >60 mL/min   Anion gap 4 (L) 5 - 15  RPR   Collection Time: 07/04/16 4:50 AM  Result Value Ref Range   RPR Ser Ql Non Reactive Non Reactive  HIV antibody   Collection Time: 07/04/16 4:50 AM  Result Value Ref Range   HIV Screen 4th Generation wRfx Non Reactive Non Reactive  Urine microscopic-add on   Collection Time: 07/04/16 4:50 AM  Result Value Ref Range    Squamous Epithelial / LPF 0-5 (A) NONE SEEN   WBC, UA 0-5 0 - 5 WBC/hpf   RBC / HPF 6-30 0 - 5 RBC/hpf   Bacteria, UA FEW (A) NONE SEEN   Urine-Other AMORPHOUS URATES/PHOSPHATES   POC urine preg, ED   Collection Time: 07/04/16 5:04 AM  Result Value Ref Range   Preg Test, Ur NEGATIVE NEGATIVE  Wet prep, genital   Collection Time: 07/04/16 5:35 AM  Result Value Ref Range   Yeast Wet Prep HPF POC NONE SEEN NONE SEEN   Trich, Wet Prep NONE SEEN NONE SEEN   Clue Cells Wet Prep HPF POC NONE SEEN NONE SEEN   WBC, Wet Prep HPF POC MODERATE (A) NONE SEEN   Sperm NONE SEEN   CA 125   Collection Time: 07/04/16 1:00 PM  Result Value Ref Range   CA 125 43.4 (H) 0.0 - 38.1 U/mL    EKG:    Orders placed or performed during the hospital encounter of 06/13/11  . EKG  . EKG    Imaging Studies:  ImagingResults  US Transvaginal Non-ob  07/04/2016 CLINICAL DATA: 35 year old female with bilateral pelvic pain for 1 day. LMP 07/03/2016. History of tubal ligation. EXAM: TRANSABDOMINAL AND TRANSVAGINAL ULTRASOUND OF PELVIS TECHNIQUE: Both transabdominal and transvaginal ultrasound examinations of the pelvis were performed. Transabdominal technique was performed for global imaging of the pelvis including uterus, ovaries, adnexal regions, and pelvic cul-de-sac. It was necessary to proceed with endovaginal exam following the transabdominal exam to visualize the endometrium and adnexa. COMPARISON: 02/18/2010 pelvic sonogram. FINDINGS: Uterus Measurements: 8.0 x 4.6 x 6.1 cm. Retroverted retroflexed uterus is mildly enlarged and globular in conformation. Diffuse myometrial heterogeneity with refractory myometrial shadowing and indistinct endometrial myometrial interface. Findings suggest diffuse adenomyosis of the uterus. No uterine fibroids or other myometrial abnormalities. Endometrium Thickness: 5 mm. No endometrial cavity fluid or focal endometrial mass demonstrated. Right ovary  Measurements: 3.3 x 3.0 x 1.9 cm. Normal appearance/no adnexal mass. Left ovary Measurements: 4.9 x 6.3 x 3.3 cm. There is a 4.1 x 1.8 x 2.4 cm left ovarian mass with homogeneous hyperechogenicity and no internal vascularity, which may represent interval growth of the similar-appearing 2.3 x 2.3 x 1.9 cm left ovarian mass on the 02/18/2010 sonogram. No additional left ovarian or left adnexal mass. Other findings No abnormal free fluid. IMPRESSION: 1. Diffuse adenomyosis of the uterus. No uterine fibroids. 2. No focal endometrial abnormality. 3. Homogeneously hyperechoic 4.1 cm left ovarian mass without internal vascularity, probably representing growth of the previously  described similar appearing 2.3 cm left ovarian mass on the 02/17/2010 sonogram, favor a left ovarian endometrioma based on the results of the 02/19/2010 pelvic MRI. Continued sonographic follow-up is advised if this mass is not resected. Electronically Signed By: Ilona Sorrel M.D. On: 07/04/2016 10:10   US Pelvis Complete  07/04/2016 CLINICAL DATA: 35 year old female with bilateral pelvic pain for 1 day. LMP 07/03/2016. History of tubal ligation. EXAM: TRANSABDOMINAL AND TRANSVAGINAL ULTRASOUND OF PELVIS TECHNIQUE: Both transabdominal and transvaginal ultrasound examinations of the pelvis were performed. Transabdominal technique was performed for global imaging of the pelvis including uterus, ovaries, adnexal regions, and pelvic cul-de-sac. It was necessary to proceed with endovaginal exam following the transabdominal exam to visualize the endometrium and adnexa. COMPARISON: 02/18/2010 pelvic sonogram. FINDINGS: Uterus Measurements: 8.0 x 4.6 x 6.1 cm. Retroverted retroflexed uterus is mildly enlarged and globular in conformation. Diffuse myometrial heterogeneity with refractory myometrial shadowing and indistinct endometrial myometrial interface. Findings suggest diffuse adenomyosis of the uterus. No uterine fibroids or other myometrial  abnormalities. Endometrium Thickness: 5 mm. No endometrial cavity fluid or focal endometrial mass demonstrated. Right ovary Measurements: 3.3 x 3.0 x 1.9 cm. Normal appearance/no adnexal mass. Left ovary Measurements: 4.9 x 6.3 x 3.3 cm. There is a 4.1 x 1.8 x 2.4 cm left ovarian mass with homogeneous hyperechogenicity and no internal vascularity, which may represent interval growth of the similar-appearing 2.3 x 2.3 x 1.9 cm left ovarian mass on the 02/18/2010 sonogram. No additional left ovarian or left adnexal mass. Other findings No abnormal free fluid. IMPRESSION: 1. Diffuse adenomyosis of the uterus. No uterine fibroids. 2. No focal endometrial abnormality. 3. Homogeneously hyperechoic 4.1 cm left ovarian mass without internal vascularity, probably representing growth of the previously described similar appearing 2.3 cm left ovarian mass on the 02/17/2010 sonogram, favor a left ovarian endometrioma based on the results of the 02/19/2010 pelvic MRI. Continued sonographic follow-up is advised if this mass is not resected. Electronically Signed By: Ilona Sorrel M.D. On: 07/04/2016 10:10           Results for orders placed or performed during the hospital encounter of 07/22/16 (from the past 336 hour(s))  Glucose, capillary   Collection Time: 07/22/16  8:01 AM  Result Value Ref Range   Glucose-Capillary 79 65 - 99 mg/dL  Results for orders placed or performed during the hospital encounter of 07/20/16 (from the past 336 hour(s))  CBC   Collection Time: 07/20/16  1:05 PM  Result Value Ref Range   WBC 6.2 4.0 - 10.5 K/uL   RBC 4.22 3.87 - 5.11 MIL/uL   Hemoglobin 12.7 12.0 - 15.0 g/dL   HCT 37.4 36.0 - 46.0 %   MCV 88.6 78.0 - 100.0 fL   MCH 30.1 26.0 - 34.0 pg   MCHC 34.0 30.0 - 36.0 g/dL   RDW 13.2 11.5 - 15.5 %   Platelets 301 150 - 400 K/uL  Comprehensive metabolic panel   Collection Time: 07/20/16  1:05 PM  Result Value Ref Range   Sodium 138 135 - 145 mmol/L    Potassium 4.5 3.5 - 5.1 mmol/L   Chloride 107 101 - 111 mmol/L   CO2 25 22 - 32 mmol/L   Glucose, Bld 49 (L) 65 - 99 mg/dL   BUN 14 6 - 20 mg/dL   Creatinine, Ser 0.79 0.44 - 1.00 mg/dL   Calcium 8.9 8.9 - 10.3 mg/dL   Total Protein 6.8 6.5 - 8.1 g/dL   Albumin 4.0 3.5 -  5.0 g/dL   AST 16 15 - 41 U/L   ALT 13 (L) 14 - 54 U/L   Alkaline Phosphatase 70 38 - 126 U/L   Total Bilirubin 0.4 0.3 - 1.2 mg/dL   GFR calc non Af Amer >60 >60 mL/min   GFR calc Af Amer >60 >60 mL/min   Anion gap 6 5 - 15  hCG, quantitative, pregnancy   Collection Time: 07/20/16  1:05 PM  Result Value Ref Range   hCG, Beta Chain, Quant, S <1 <5 mIU/mL  Urinalysis, Routine w reflex microscopic (not at [facility])   Collection Time: 07/20/16  1:05 PM  Result Value Ref Range   Color, Urine YELLOW YELLOW   APPearance CLEAR CLEAR   Specific Gravity, Urine 1.020 1.005 - 1.030   pH 6.0 5.0 - 8.0   Glucose, UA NEGATIVE NEGATIVE mg/dL   Hgb urine dipstick NEGATIVE NEGATIVE   Bilirubin Urine NEGATIVE NEGATIVE   Ketones, ur NEGATIVE NEGATIVE mg/dL   Protein, ur NEGATIVE NEGATIVE mg/dL   Nitrite NEGATIVE NEGATIVE   Leukocytes, UA NEGATIVE NEGATIVE  Type and screen   Collection Time: 07/20/16  1:05 PM  Result Value Ref Range   ABO/RH(D) O POS    Antibody Screen NEG    Sample Expiration 08/03/2016    Extend sample reason NO TRANSFUSIONS OR PREGNANCY IN THE PAST 3 MONTHS          Assessment: Ovarian mass, left  Dyspareunia, female  Dysmenorrhea   Patient Active Problem List   Diagnosis Date Noted  . S/P laparoscopic assisted vaginal hysterectomy ([facility]) 07/22/2016  . Hx of chlamydia infection 04/12/2013    Plan: Laparoscopic left salpingo oophorectomy and right salpingectomy with vaginal hysterectomy 07/22/2016  Abdulloh Ullom H

## 2016-07-15 NOTE — Patient Instructions (Signed)
Desiree Pope  07/15/2016     @[email or web address] @   Your procedure is scheduled on  07/22/2016   Report to [facility] at  700  A.M.  Call this number if you have problems the morning of surgery:  [phone]   Remember:  Do not eat food or drink liquids after midnight.  Take these medicines the morning of surgery with A SIP OF WATER  Claritin, oxycodone, phenergan.   Do not wear jewelry, make-up or nail polish.  Do not wear lotions, powders, or perfumes.  You may wear deoderant.  Do not shave 48 hours prior to surgery.  Men may shave face and neck.  Do not bring valuables to the hospital.  [facility] is not responsible for any belongings or valuables.  Contacts, dentures or bridgework may not be worn into surgery.  Leave your suitcase in the car.  After surgery it may be brought to your room.  For patients admitted to the hospital, discharge time will be determined by your treatment team.  Patients discharged the day of surgery will not be allowed to drive home.   Name and phone number of your driver:   family Special instructions:  none  Please read over the following fact sheets that you were given. Coughing and Deep Breathing, Blood Transfusion Information, Surgical Site Infection Prevention, Anesthesia Post-op Instructions and Care and Recovery After Surgery      Salpingectomy Salpingectomy, also called tubectomy, is the surgical removal of one of the fallopian tubes. The fallopian tubes are tubes that are connected to the uterus. These tubes transport the egg from the ovary to the uterus. A salpingectomy may be done for various reasons, including:   A tubal (ectopic) pregnancy. This is especially true if the tube ruptures.  An infected fallopian tube.  The need to remove the fallopian tube when removing an ovary with a cyst or tumor.  The need to remove the fallopian tube when removing the uterus.  Cancer of the fallopian tube  or nearby organs. Removing one fallopian tube does not prevent you from becoming pregnant. It also does not cause problems with your menstrual periods.  LET [facility] CARE PROVIDER KNOW ABOUT:  Any allergies you have.  All medicines you are taking, including vitamins, herbs, eye drops, creams, and over-the-counter medicines.  Previous problems you or members of your family have had with the use of anesthetics.  Any blood disorders you have.  Previous surgeries you have had.  Medical conditions you have. RISKS AND COMPLICATIONS  Generally, this is a safe procedure. However, as with any procedure, complications can occur. Possible complications include:  Injury to surrounding organs.  Bleeding.  Infection.  Problems related to anesthesia. BEFORE THE PROCEDURE  Ask your health care provider about changing or stopping your regular medicines. You may need to stop taking certain medicines, such as aspirin or blood thinners, at least 1 week before the surgery.  Do not eat or drink anything for at least 8 hours before the surgery.  If you smoke, do not smoke for at least 2 weeks before the surgery.  Make plans to have someone drive you home after the procedure or after your hospital stay. Also arrange for someone to help you with activities during recovery. PROCEDURE   You will be given medicine to help you relax before the procedure (sedative). You will then be given medicine to  make you sleep through the procedure (general anesthetic). These medicines will be given through an IV access tube that is put into one of your veins.  Once you are asleep, your lower abdomen will be shaved and cleaned. A thin, flexible tube (catheter) will be placed in your bladder.  The surgeon may use a laparoscopic, robotic, or open technique for this surgery:  In the laparoscopic technique, the surgery is done through two small cuts (incisions) in the abdomen. A thin, lighted tube with a tiny camera  on the end (laparoscope) is inserted into one of the incisions. The tools needed for the procedure are put through the other incision.  A robotic technique may be chosen to perform complex surgery in a small space. In the robotic technique, small incisions will be made. A camera and surgical instruments are passed through the incisions. Surgical instruments will be controlled with the help of a robotic arm.  In the open technique, the surgery is done through one large incision in the abdomen.  Using any of these techniques, the surgeon removes the fallopian tube from where it attaches to the uterus. The blood vessels will be clamped and tied.  The surgeon then uses staples or stitches to close the incision or incisions. AFTER THE PROCEDURE   You will be taken to a recovery area where your progress will be monitored for 1-3 hours.  If the laparoscopic technique was used, you may be allowed to go home after several hours. You may have some shoulder pain after the laparoscopic procedure. This is normal and usually goes away in a day or two.  If the open technique was used, you will be admitted to the hospital for a couple of days.  You will be given pain medicine if needed.  The IV access tube and catheter will be removed before you are discharged.   This information is not intended to replace advice given to you by your health care provider. Make sure you discuss any questions you have with your health care provider.   Document Released: 05/02/2009 Document Revised: 01/04/2015 Document Reviewed: 06/07/2013 Elsevier Interactive Patient Education 2016 [place] After Refer to this sheet in the next few weeks. These instructions provide you with information on caring for yourself after your procedure. Your health care provider may also give you more specific instructions. Your treatment has been planned according to current medical practices, but problems sometimes  occur. Call your health care provider if you have any problems or questions after your procedure. WHAT TO EXPECT AFTER THE PROCEDURE After your procedure, it is typical to have the following:  Abdominal pain that can be controlled with pain medicine.  Vaginal spotting.  Tiredness. HOME CARE INSTRUCTIONS  Get plenty of rest and sleep.  Only take over-the-counter or prescription medicines as directed by your health care provider.  Keep incision areas clean and dry. Remove or change any bandages (dressings) only as directed by your health care provider.  You may resume your regular diet. Eat a well-balanced diet.  Drink enough fluids to keep your urine clear or pale yellow.  Limit exercise and activities as directed by your health care provider. Do not lift anything heavier than 5 lb (2.3 kg) until your health care provider approves.  Do not drive until your health care provider approves.  Do not have sexual intercourse until your health care provider says it is okay.  Take your temperature twice a day for the first week. Write those temperatures  down.  Follow up with your health care provider as directed. SEEK MEDICAL CARE IF:  You have pain when you urinate.  You see pus coming out of the incision, or the incision is separating.  You have increasing abdominal pain.  You have swelling or redness in the incision area.  You develop a rash.  You feel lightheaded.  You have pain that is not controlled with medicine. SEEK IMMEDIATE MEDICAL CARE IF:  You develop a fever.  You have increasing abdominal pain.  You develop chest or leg pain.  You develop shortness of breath.  You pass out.   This information is not intended to replace advice given to you by your health care provider. Make sure you discuss any questions you have with your health care provider.   Document Released: 03/20/2011 Document Revised: 01/04/2015 Document Reviewed: 06/07/2013 Elsevier  Interactive Patient Education 2016 [employer] Unilateral Salpingo-Oophorectomy Unilateral salpingo-oophorectomy is the surgical removal of one fallopian tube and ovary. The ovaries are small organs that produce eggs in women. The fallopian tubes transport the egg from the ovary to the womb (uterus). A unilateral salpingo-oophorectomy may be done for various reasons, including:  Infection in the fallopian tube and ovary.  Scar tissue in the fallopian tube and ovary (adhesions).  A cyst or tumor on the ovary.  A need to remove the fallopian tube and ovary when removing the uterus.  Cancer of the fallopian tube or ovary. The removal of one fallopian tube and ovary will not prevent you from becoming pregnant, put you into menopause, or cause problems with your menstrual periods or sex drive. LET [facility] CARE PROVIDER KNOW ABOUT:  Any allergies you have.  All medicines you are taking, including vitamins, herbs, eye drops, creams, and over-the-counter medicines.  Previous problems you or members of your family have had with the use of anesthetics.  Any blood disorders you have.  Previous surgeries you have had.  Medical conditions you have. RISKS AND COMPLICATIONS  Generally, this is a safe procedure. However, as with any procedure, complications can occur. Possible complications include:  Injury to surrounding organs.  Bleeding.  Infection.  Blood clots in the legs or lungs.  Problems related to anesthesia. BEFORE THE PROCEDURE  Ask your health care provider about changing or stopping your regular medicines. You may need to stop taking certain medicines, such as aspirin or blood thinners, at least 1 week before the surgery.  Do not eat or drink anything for at least 8 hours before the surgery.  If you smoke, do not smoke for at least 2 weeks before the surgery.  Make plans to have someone drive you home after the procedure or after your hospital stay. Also arrange  for someone to help you with activities during recovery. PROCEDURE  You will be given medicine to help you relax before the procedure (sedative). You will then be given medicine to make you sleep through the procedure (general anesthetic). These medicines will be given through an IV access tube that is put into one of your veins.  Once you are asleep, your lower abdomen will be shaved and cleaned. A thin, flexible tube (catheter) will be placed in your bladder.  The surgeon may use a laparoscopic, robotic, or open technique for this surgery:  In the laparoscopic technique, the surgery is done through two small cuts (incisions) in the abdomen. A thin, lighted tube with a tiny camera on the end (laparoscope) is inserted into one of the incisions. The  tools needed for the procedure are put through the other incision.  A robotic technique may be chosen to perform complex surgery in a small space. In the robotic technique, small incisions are made. A camera and surgical instruments are passed through the incisions. Surgical instruments are controlled with the help of a robotic arm.  In the open technique, the surgery is done through one large incision in the abdomen.  Using any of these techniques, the surgeon will remove the fallopian tube and ovary. The blood vessels will be clamped and tied.  The surgeon will then use staples or stitches to close the incision or incisions. AFTER THE PROCEDURE  You will be taken to a recovery area where your progress will be monitored for 1-3 hours. Your blood pressure, pulse, and temperature will be checked often. You will remain in the recovery area until you are stable and waking up.  If the laparoscopic technique was used, you may be allowed to go home after several hours. You may have some shoulder pain. This is normal and usually goes away in a day or two.  If the open technique was used, you will be admitted to the hospital for a couple of days.  You  will be given pain medicine as necessary.  The IV tube and catheter will be removed before you are discharged.   This information is not intended to replace advice given to you by your health care provider. Make sure you discuss any questions you have with your health care provider.   Document Released: 10/11/2009 Document Revised: 12/19/2013 Document Reviewed: 06/07/2013 Elsevier Interactive Patient Education 2016 Elsevier Inc. Unilateral Salpingo-Oophorectomy, Care After Refer to this sheet in the next few weeks. These instructions provide you with information on caring for yourself after your procedure. Your health care provider may also give you more specific instructions. Your treatment has been planned according to current medical practices, but problems sometimes occur. Call your health care provider if you have any problems or questions after your procedure. WHAT TO EXPECT AFTER THE PROCEDURE After your procedure, it is typical to have the following:  Abdominal pain that can be controlled with pain medicine.  Vaginal spotting.  Constipation. HOME CARE INSTRUCTIONS   Get plenty of rest and sleep.  Only take over-the-counter or prescription medicines as directed by your health care provider. Do not take aspirin. It can cause bleeding.  Keep incision areas clean and dry. Remove or change any bandages (dressings) only as directed by your health care provider.  Follow your health care provider's advice regarding diet.  Drink enough fluids to keep your urine clear or pale yellow.  Limit exercise and activities as directed by your health care provider. Do not lift anything heavier than 5 pounds (2.3 kg) until your health care provider approves.  Do not drive until your health care provider approves.  Do not drink alcohol until your health care provider approves.  Do not have sexual intercourse until your health care provider says it is OK.  Take your temperature twice a day  and write it down.  If you become constipated, you may:  Ask your health care provider about taking a mild laxative.  Add more fruit and bran to your diet.  Drink more fluids.  Follow up with your health care provider as directed. SEEK MEDICAL CARE IF:   You have swelling or redness in the incision area.  You develop a rash.  You feel lightheaded.  You have pain that is not  controlled with medicine.  You have pain, swelling, or redness where the IV access tube was placed. SEEK IMMEDIATE MEDICAL CARE IF:  You have a fever.  You develop increasing abdominal pain.  You see pus coming out of the incision, or the incision is separating.  You notice a bad smell coming from the wound or dressing.  You have excessive vaginal bleeding.  You feel sick to your stomach (nauseous) and vomit.  You have leg or chest pain.  You have pain when you urinate.  You develop shortness of breath.  You pass out.   This information is not intended to replace advice given to you by your health care provider. Make sure you discuss any questions you have with your health care provider.   Document Released: 10/10/2009 Document Revised: 10/04/2013 Document Reviewed: 06/07/2013 Elsevier Interactive Patient Education 2016 [place]. Laparoscopically Assisted Vaginal Hysterectomy A laparoscopically assisted vaginal hysterectomy (LAVH) is a surgical procedure to remove the uterus and cervix, and sometimes the ovaries and fallopian tubes. During an LAVH, some of the surgical removal is done through the vagina, and the rest is done through a few small surgical cuts (incisions) in the abdomen.  This procedure is usually considered in women when a vaginal hysterectomy is not an option. Your health care provider will discuss the risks and benefits of the different surgical techniques at your appointment. Generally, recovery time is faster and there are fewer complications after laparoscopic procedures  than after open incisional procedures. LET [facility] CARE PROVIDER KNOW ABOUT:   Any allergies you have.  All medicines you are taking, including vitamins, herbs, eye drops, creams, and over-the-counter medicines.  Previous problems you or members of your family have had with the use of anesthetics.  Any blood disorders you have.  Previous surgeries you have had.  Medical conditions you have. RISKS AND COMPLICATIONS Generally, this is a safe procedure. However, as with any procedure, complications can occur. Possible complications include:  Allergies to medicines.  Difficulty breathing.  Bleeding.  Infection.  Damage to other structures near your uterus and cervix. BEFORE THE PROCEDURE  Ask your health care provider about changing or stopping your regular medicines.  Take certain medicines, such as a colon-emptying preparation, as directed.  Do not eat or drink anything for at least 8 hours before your surgery.  Stop smoking if you smoke. Stopping will improve your health after surgery.  Arrange for a ride home after surgery and for help at home during recovery. PROCEDURE   An IV tube will be put into one of your veins in order to give you fluids and medicines.  You will receive medicines to relax you and medicines that make you sleep (general anesthetic).  You may have a flexible tube (catheter) put into your bladder to drain urine.  You may have a tube put through your nose or mouth that goes into your stomach (nasogastric tube). The nasogastric tube removes digestive fluids and prevents you from feeling nauseated and from vomiting.  Tight-fitting (compression) stockings will be placed on your legs to promote circulation.  Three to four small incisions will be made in your abdomen. An incision also will be made in your vagina. Probes and tools will be inserted into the small incisions. The uterus and cervix are removed (and possibly your ovaries and fallopian  tubes) through your vagina as well as through the small incisions that were made in the abdomen.  Your vagina is then sewn back to normal. AFTER THE  PROCEDURE  You may have a liquid diet temporarily. You will most likely return to, and tolerate, your usual diet the day after surgery.  You will be passing urine through a catheter. It will be removed the day after surgery.  Your temperature, breathing rate, heart rate, blood pressure, and oxygen level will be monitored regularly.  You will still wear compression stockings on your legs until you are able to move around.  You will use a special device or do breathing exercises to keep your lungs clear.  You will be encouraged to walk as soon as possible.   This information is not intended to replace advice given to you by your health care provider. Make sure you discuss any questions you have with your health care provider.   Document Released: 12/03/2011 Document Revised: 01/04/2015 Document Reviewed: 06/29/2013 Elsevier Interactive Patient Education 2016 [place] Laparoscopically Assisted Vaginal Hysterectomy, Care After Refer to this sheet in the next few weeks. These instructions provide you with information on caring for yourself after your procedure. Your health care provider may also give you more specific instructions. Your treatment has been planned according to current medical practices, but problems sometimes occur. Call your health care provider if you have any problems or questions after your procedure. WHAT TO EXPECT AFTER THE PROCEDURE After your procedure, it is typical to have the following:  Abdominal pain. You will be given pain medicine to control it.  Sore throat from the breathing tube that was inserted during surgery. HOME CARE INSTRUCTIONS  Only take over-the-counter or prescription medicines for pain, discomfort, or fever as directed by your health care provider.  Do not take aspirin. It can cause  bleeding.  Do not drive when taking pain medicine.  Follow your health care provider's advice regarding diet, exercise, lifting, driving, and general activities.  Resume your usual diet as directed and allowed.  Get plenty of rest and sleep.  Do not douche, use tampons, or have sexual intercourse for at least 6 weeks, or until your health care provider gives you permission.  Change your bandages (dressings) as directed by your health care provider.  Monitor your temperature and notify your health care provider of a fever.  Take showers instead of baths for 2-3 weeks.  Do not drink alcohol until your health care provider gives you permission.  If you develop constipation, you may take a mild laxative with your health care provider's permission. Bran foods may help with constipation problems. Drinking enough fluids to keep your urine clear or pale yellow may help as well.  Try to have someone home with you for 1-2 weeks to help around the house.  Keep all of your follow-up appointments as directed by your health care provider. SEEK MEDICAL CARE IF:   You have swelling, redness, or increasing pain around your incision sites.  You have pus coming from your incision.  You notice a bad smell coming from your incision.  Your incision breaks open.  You feel dizzy or lightheaded.  You have pain or bleeding when you urinate.  You have persistent diarrhea.  You have persistent nausea and vomiting.  You have abnormal vaginal discharge.  You have a rash.  You have any type of abnormal reaction or develop an allergy to your medicine.  You have poor pain control with your prescribed medicine. SEEK IMMEDIATE MEDICAL CARE IF:   You have a fever.  You have severe abdominal pain.  You have chest pain.  You have shortness of breath.  You faint.  You have pain, swelling, or redness in your leg.  You have heavy vaginal bleeding with blood clots. MAKE SURE YOU:  Understand  these instructions.  Will watch your condition.  Will get help right away if you are not doing well or get worse.   This information is not intended to replace advice given to you by your health care provider. Make sure you discuss any questions you have with your health care provider.   Document Released: 12/03/2011 Document Revised: 12/19/2013 Document Reviewed: 06/29/2013 Elsevier Interactive Patient Education 2016 [place] Anesthesia, Adult General anesthesia is a sleep-like state of non-feeling produced by medicines (anesthetics). General anesthesia prevents you from being alert and feeling pain during a medical procedure. Your caregiver may recommend general anesthesia if your procedure:  Is long.  Is painful or uncomfortable.  Would be frightening to see or hear.  Requires you to be still.  Affects your breathing.  Causes significant blood loss. LET YOUR CAREGIVER KNOW ABOUT:  Allergies to food or medicine.  Medicines taken, including vitamins, herbs, eyedrops, over-the-counter medicines, and creams.  Use of steroids (by mouth or creams).  Previous problems with anesthetics or numbing medicines, including problems experienced by relatives.  History of bleeding problems or blood clots.  Previous surgeries and types of anesthetics received.  Possibility of pregnancy, if this applies.  Use of cigarettes, alcohol, or illegal drugs.  Any health condition(s), especially diabetes, sleep apnea, and high blood pressure. RISKS AND COMPLICATIONS General anesthesia rarely causes complications. However, if complications do occur, they can be life threatening. Complications include:  A lung infection.  A stroke.  A heart attack.  Waking up during the procedure. When this occurs, the patient may be unable to move and communicate that he or she is awake. The patient may feel severe pain. Older adults and adults with serious medical problems are more likely to  have complications than adults who are young and healthy. Some complications can be prevented by answering all of your caregiver's questions thoroughly and by following all pre-procedure instructions. It is important to tell your caregiver if any of the pre-procedure instructions, especially those related to diet, were not followed. Any food or liquid in the stomach can cause problems when you are under general anesthesia. BEFORE THE PROCEDURE  Ask your caregiver if you will have to spend the night at the hospital. If you will not have to spend the night, arrange to have an adult drive you and stay with you for 24 hours.  Follow your caregiver's instructions if you are taking dietary supplements or medicines. Your caregiver may tell you to stop taking them or to reduce your dosage.  Do not smoke for as long as possible before your procedure. If possible, stop smoking 3-6 weeks before the procedure.  Do not take new dietary supplements or medicines within 1 week of your procedure unless your caregiver approves them.  Do not eat within 8 hours of your procedure or as directed by your caregiver. Drink only clear liquids, such as water, black coffee (without milk or cream), and fruit juices (without pulp).  Do not drink within 3 hours of your procedure or as directed by your caregiver.  You may brush your teeth on the morning of the procedure, but make sure to spit out the toothpaste and water when finished. PROCEDURE  You will receive anesthetics through a mask, through an intravenous (IV) access tube, or through both. A doctor who specializes in anesthesia (anesthesiologist)  or a nurse who specializes in anesthesia (nurse anesthetist) or both will stay with you throughout the procedure to make sure you remain unconscious. He or she will also watch your blood pressure, pulse, and oxygen levels to make sure that the anesthetics do not cause any problems. Once you are asleep, a breathing tube or mask may  be used to help you breathe. AFTER THE PROCEDURE You will wake up after the procedure is complete. You may be in the room where the procedure was performed or in a recovery area. You may have a sore throat if a breathing tube was used. You may also feel:  Dizzy.  Weak.  Drowsy.  Confused.  Nauseous.  Cold. These are all normal responses and can be expected to last for up to 24 hours after the procedure is complete. A caregiver will tell you when you are ready to go home. This will usually be when you are fully awake and in stable condition.   This information is not intended to replace advice given to you by your health care provider. Make sure you discuss any questions you have with your health care provider.   Document Released: 03/22/2008 Document Revised: 01/04/2015 Document Reviewed: 04/13/2012 Elsevier Interactive Patient Education 2016 [place] Anesthesia, Adult, Care After Refer to this sheet in the next few weeks. These instructions provide you with information on caring for yourself after your procedure. Your health care provider may also give you more specific instructions. Your treatment has been planned according to current medical practices, but problems sometimes occur. Call your health care provider if you have any problems or questions after your procedure. WHAT TO EXPECT AFTER THE PROCEDURE After the procedure, it is typical to experience:  Sleepiness.  Nausea and vomiting. HOME CARE INSTRUCTIONS  For the first 24 hours after general anesthesia:  Have a responsible person with you.  Do not drive a car. If you are alone, do not take public transportation.  Do not drink alcohol.  Do not take medicine that has not been prescribed by your health care provider.  Do not sign important papers or make important decisions.  You may resume a normal diet and activities as directed by your health care provider.  Change bandages (dressings) as  directed.  If you have questions or problems that seem related to general anesthesia, call the hospital and ask for the anesthetist or anesthesiologist on call. SEEK MEDICAL CARE IF:  You have nausea and vomiting that continue the day after anesthesia.  You develop a rash. SEEK IMMEDIATE MEDICAL CARE IF:   You have difficulty breathing.  You have chest pain.  You have any allergic problems.   This information is not intended to replace advice given to you by your health care provider. Make sure you discuss any questions you have with your health care provider.   Document Released: 03/22/2001 Document Revised: 01/04/2015 Document Reviewed: 04/13/2012 Elsevier Interactive Patient Education [employer].

## 2016-07-17 ENCOUNTER — Encounter (HOSPITAL_COMMUNITY)
Admission: RE | Admit: 2016-07-17 | Discharge: 2016-07-17 | Disposition: A | Discharge status: Home / Self Care | Payer: BLUE CROSS/BLUE SHIELD | Source: Ambulatory Visit | Attending: Obstetrics & Gynecology | Admitting: Obstetrics & Gynecology

## 2016-07-20 ENCOUNTER — Encounter (HOSPITAL_COMMUNITY)
Admission: RE | Admit: 2016-07-20 | Discharge: 2016-07-20 | Disposition: A | Discharge status: Home / Self Care | Payer: BLUE CROSS/BLUE SHIELD | Source: Ambulatory Visit | Attending: Obstetrics & Gynecology | Admitting: Obstetrics & Gynecology

## 2016-07-20 ENCOUNTER — Encounter (HOSPITAL_COMMUNITY): Payer: Self-pay

## 2016-07-20 ENCOUNTER — Other Ambulatory Visit: Payer: Self-pay | Admitting: Obstetrics & Gynecology

## 2016-07-20 DIAGNOSIS — N800 Endometriosis of uterus: Secondary | ICD-10-CM | POA: Diagnosis not present

## 2016-07-20 DIAGNOSIS — Z833 Family history of diabetes mellitus: Secondary | ICD-10-CM | POA: Diagnosis not present

## 2016-07-20 DIAGNOSIS — Z91041 Radiographic dye allergy status: Secondary | ICD-10-CM | POA: Diagnosis not present

## 2016-07-20 DIAGNOSIS — Z886 Allergy status to analgesic agent status: Secondary | ICD-10-CM | POA: Diagnosis not present

## 2016-07-20 DIAGNOSIS — Z029 Encounter for administrative examinations, unspecified: Secondary | ICD-10-CM

## 2016-07-20 DIAGNOSIS — N801 Endometriosis of ovary: Secondary | ICD-10-CM | POA: Diagnosis not present

## 2016-07-20 DIAGNOSIS — Z8349 Family history of other endocrine, nutritional and metabolic diseases: Secondary | ICD-10-CM | POA: Diagnosis not present

## 2016-07-20 DIAGNOSIS — N83202 Unspecified ovarian cyst, left side: Secondary | ICD-10-CM | POA: Diagnosis present

## 2016-07-20 DIAGNOSIS — F17200 Nicotine dependence, unspecified, uncomplicated: Secondary | ICD-10-CM | POA: Diagnosis not present

## 2016-07-20 DIAGNOSIS — N946 Dysmenorrhea, unspecified: Secondary | ICD-10-CM | POA: Diagnosis not present

## 2016-07-20 DIAGNOSIS — Z8249 Family history of ischemic heart disease and other diseases of the circulatory system: Secondary | ICD-10-CM | POA: Diagnosis not present

## 2016-07-20 DIAGNOSIS — Z809 Family history of malignant neoplasm, unspecified: Secondary | ICD-10-CM | POA: Diagnosis not present

## 2016-07-20 DIAGNOSIS — D252 Subserosal leiomyoma of uterus: Secondary | ICD-10-CM | POA: Diagnosis not present

## 2016-07-20 DIAGNOSIS — Z79899 Other long term (current) drug therapy: Secondary | ICD-10-CM | POA: Diagnosis not present

## 2016-07-20 DIAGNOSIS — N803 Endometriosis of pelvic peritoneum: Secondary | ICD-10-CM | POA: Diagnosis not present

## 2016-07-20 LAB — TYPE AND SCREEN
ABO/RH(D): O POS
ANTIBODY SCREEN: NEGATIVE

## 2016-07-20 LAB — COMPREHENSIVE METABOLIC PANEL
ALBUMIN: 4 g/dL (ref 3.5–5.0)
ALT: 13 U/L — AB (ref 14–54)
AST: 16 U/L (ref 15–41)
Alkaline Phosphatase: 70 U/L (ref 38–126)
Anion gap: 6 (ref 5–15)
BUN: 14 mg/dL (ref 6–20)
CHLORIDE: 107 mmol/L (ref 101–111)
CO2: 25 mmol/L (ref 22–32)
CREATININE: 0.79 mg/dL (ref 0.44–1.00)
Calcium: 8.9 mg/dL (ref 8.9–10.3)
GFR calc non Af Amer: >60 mL/min (ref >60)
GLUCOSE: 49 mg/dL — AB (ref 65–99)
Potassium: 4.5 mmol/L (ref 3.5–5.1)
SODIUM: 138 mmol/L (ref 135–145)
Total Bilirubin: 0.4 mg/dL (ref 0.3–1.2)
Total Protein: 6.8 g/dL (ref 6.5–8.1)

## 2016-07-20 LAB — CBC
HCT: 37.4 % (ref 36.0–46.0)
HEMOGLOBIN: 12.7 g/dL (ref 12.0–15.0)
MCH: 30.1 pg (ref 26.0–34.0)
MCHC: 34 g/dL (ref 30.0–36.0)
MCV: 88.6 fL (ref 78.0–100.0)
PLATELETS: 301 10*3/uL (ref 150–400)
RBC: 4.22 MIL/uL (ref 3.87–5.11)
RDW: 13.2 % (ref 11.5–15.5)
WBC: 6.2 10*3/uL (ref 4.0–10.5)

## 2016-07-20 LAB — URINALYSIS, ROUTINE W REFLEX MICROSCOPIC
Bilirubin Urine: NEGATIVE
Glucose, UA: NEGATIVE mg/dL
HGB URINE DIPSTICK: NEGATIVE
Ketones, ur: NEGATIVE mg/dL
Leukocytes, UA: NEGATIVE
Nitrite: NEGATIVE
PH: 6 (ref 5.0–8.0)
Protein, ur: NEGATIVE mg/dL
SPECIFIC GRAVITY, URINE: 1.02 (ref 1.005–1.030)

## 2016-07-20 LAB — HCG, QUANTITATIVE, PREGNANCY: hCG, Beta Chain, Quant, S: <1 m[IU]/mL (ref <5)

## 2016-07-22 ENCOUNTER — Encounter (HOSPITAL_COMMUNITY)
Admission: RE | Disposition: A | Discharge status: Home / Self Care | Payer: Self-pay | Source: Ambulatory Visit | Attending: Obstetrics & Gynecology

## 2016-07-22 ENCOUNTER — Ambulatory Visit (HOSPITAL_COMMUNITY): Payer: BLUE CROSS/BLUE SHIELD | Admitting: Anesthesiology

## 2016-07-22 ENCOUNTER — Observation Stay (HOSPITAL_COMMUNITY)
Admission: RE | Admit: 2016-07-22 | Discharge: 2016-07-23 | Disposition: A | Discharge status: Home / Self Care | Payer: BLUE CROSS/BLUE SHIELD | Source: Ambulatory Visit | Attending: Obstetrics & Gynecology | Admitting: Obstetrics & Gynecology

## 2016-07-22 ENCOUNTER — Encounter (HOSPITAL_COMMUNITY): Payer: Self-pay | Admitting: Anesthesiology

## 2016-07-22 DIAGNOSIS — N941 Unspecified dyspareunia: Secondary | ICD-10-CM | POA: Diagnosis not present

## 2016-07-22 DIAGNOSIS — D251 Intramural leiomyoma of uterus: Secondary | ICD-10-CM | POA: Diagnosis not present

## 2016-07-22 DIAGNOSIS — N800 Endometriosis of uterus: Secondary | ICD-10-CM | POA: Insufficient documentation

## 2016-07-22 DIAGNOSIS — Z91041 Radiographic dye allergy status: Secondary | ICD-10-CM | POA: Insufficient documentation

## 2016-07-22 DIAGNOSIS — N946 Dysmenorrhea, unspecified: Secondary | ICD-10-CM | POA: Diagnosis not present

## 2016-07-22 DIAGNOSIS — N801 Endometriosis of ovary: Secondary | ICD-10-CM | POA: Diagnosis not present

## 2016-07-22 DIAGNOSIS — Z8349 Family history of other endocrine, nutritional and metabolic diseases: Secondary | ICD-10-CM | POA: Insufficient documentation

## 2016-07-22 DIAGNOSIS — D252 Subserosal leiomyoma of uterus: Principal | ICD-10-CM | POA: Insufficient documentation

## 2016-07-22 DIAGNOSIS — Z90710 Acquired absence of both cervix and uterus: Secondary | ICD-10-CM | POA: Diagnosis present

## 2016-07-22 DIAGNOSIS — Z8249 Family history of ischemic heart disease and other diseases of the circulatory system: Secondary | ICD-10-CM | POA: Insufficient documentation

## 2016-07-22 DIAGNOSIS — F17200 Nicotine dependence, unspecified, uncomplicated: Secondary | ICD-10-CM | POA: Insufficient documentation

## 2016-07-22 DIAGNOSIS — Z886 Allergy status to analgesic agent status: Secondary | ICD-10-CM | POA: Insufficient documentation

## 2016-07-22 DIAGNOSIS — Z809 Family history of malignant neoplasm, unspecified: Secondary | ICD-10-CM | POA: Insufficient documentation

## 2016-07-22 DIAGNOSIS — Z833 Family history of diabetes mellitus: Secondary | ICD-10-CM | POA: Insufficient documentation

## 2016-07-22 DIAGNOSIS — Z79899 Other long term (current) drug therapy: Secondary | ICD-10-CM | POA: Insufficient documentation

## 2016-07-22 DIAGNOSIS — N803 Endometriosis of pelvic peritoneum: Secondary | ICD-10-CM | POA: Insufficient documentation

## 2016-07-22 HISTORY — PX: LAPAROSCOPIC UNILATERAL SALPINGECTOMY: SHX5934

## 2016-07-22 HISTORY — PX: LAPAROSCOPIC VAGINAL HYSTERECTOMY WITH SALPINGO OOPHORECTOMY: SHX6681

## 2016-07-22 LAB — GLUCOSE, CAPILLARY: GLUCOSE-CAPILLARY: 79 mg/dL (ref 65–99)

## 2016-07-22 SURGERY — SALPINGECTOMY, UNILATERAL, LAPAROSCOPIC
Anesthesia: General | Laterality: Right

## 2016-07-22 MED ORDER — CEFAZOLIN SODIUM-DEXTROSE 2-4 GM/100ML-% IV SOLN
2.0000 g | INTRAVENOUS | Status: AC
  Administered 2016-07-22: 2 g via INTRAVENOUS
  Filled 2016-07-22: qty 100

## 2016-07-22 MED ORDER — PROPOFOL 10 MG/ML IV BOLUS
INTRAVENOUS | Status: DC | PRN
  Administered 2016-07-22: 50 mg via INTRAVENOUS
  Administered 2016-07-22: 150 mg via INTRAVENOUS

## 2016-07-22 MED ORDER — FENTANYL CITRATE (PF) 250 MCG/5ML IJ SOLN
INTRAMUSCULAR | Status: AC
  Filled 2016-07-22: qty 5

## 2016-07-22 MED ORDER — ONDANSETRON HCL 8 MG PO TABS
8.0000 mg | ORAL_TABLET | Freq: Four times a day (QID) | ORAL | Status: DC | PRN
Start: 2016-07-22 — End: 2016-07-22
  Filled 2016-07-22: qty 1

## 2016-07-22 MED ORDER — ROCURONIUM BROMIDE 100 MG/10ML IV SOLN
INTRAVENOUS | Status: DC | PRN
  Administered 2016-07-22: 10 mg via INTRAVENOUS
  Administered 2016-07-22: 30 mg via INTRAVENOUS
  Administered 2016-07-22: 10 mg via INTRAVENOUS

## 2016-07-22 MED ORDER — DOCUSATE SODIUM 100 MG PO CAPS
100.0000 mg | ORAL_CAPSULE | Freq: Two times a day (BID) | ORAL | Status: DC
  Administered 2016-07-22 – 2016-07-23 (×3): 100 mg via ORAL
  Filled 2016-07-22 (×3): qty 1

## 2016-07-22 MED ORDER — MIDAZOLAM HCL 2 MG/2ML IJ SOLN
1.0000 mg | INTRAMUSCULAR | Status: DC | PRN
  Administered 2016-07-22: 2 mg via INTRAVENOUS

## 2016-07-22 MED ORDER — LIDOCAINE HCL (CARDIAC) 10 MG/ML IV SOLN
INTRAVENOUS | Status: DC | PRN
  Administered 2016-07-22: 50 mg via INTRAVENOUS

## 2016-07-22 MED ORDER — OXYCODONE-ACETAMINOPHEN 5-325 MG PO TABS
1.0000 | ORAL_TABLET | ORAL | Status: DC | PRN
  Administered 2016-07-23 (×2): 2 via ORAL
  Filled 2016-07-22 (×2): qty 2

## 2016-07-22 MED ORDER — BUPIVACAINE LIPOSOME 1.3 % IJ SUSP
INTRAMUSCULAR | Status: DC | PRN
  Administered 2016-07-22: 20 mL

## 2016-07-22 MED ORDER — SODIUM CHLORIDE 0.9 % IR SOLN
Status: DC | PRN
  Administered 2016-07-22: 3000 mL

## 2016-07-22 MED ORDER — FENTANYL CITRATE (PF) 100 MCG/2ML IJ SOLN
INTRAMUSCULAR | Status: AC
  Filled 2016-07-22: qty 2

## 2016-07-22 MED ORDER — SODIUM CHLORIDE 0.9 % IV SOLN: 8.0000 mg | Freq: Four times a day (QID) | INTRAVENOUS | Status: DC | PRN

## 2016-07-22 MED ORDER — KCL IN DEXTROSE-NACL 20-5-0.45 MEQ/L-%-% IV SOLN
INTRAVENOUS | Status: DC
  Administered 2016-07-22 – 2016-07-23 (×3): via INTRAVENOUS

## 2016-07-22 MED ORDER — LACTATED RINGERS IV SOLN
INTRAVENOUS | Status: DC
  Administered 2016-07-22 (×2): via INTRAVENOUS

## 2016-07-22 MED ORDER — ONDANSETRON HCL 4 MG/2ML IJ SOLN
INTRAMUSCULAR | Status: AC
  Filled 2016-07-22: qty 2

## 2016-07-22 MED ORDER — ONDANSETRON HCL 4 MG PO TABS
8.0000 mg | ORAL_TABLET | Freq: Four times a day (QID) | ORAL | Status: DC | PRN
  Administered 2016-07-22 – 2016-07-23 (×3): 8 mg via ORAL
  Filled 2016-07-22 (×3): qty 2

## 2016-07-22 MED ORDER — ALUM & MAG HYDROXIDE-SIMETH 200-200-20 MG/5ML PO SUSP: 30.0000 mL | ORAL | Status: DC | PRN

## 2016-07-22 MED ORDER — BISACODYL 10 MG RE SUPP: 10.0000 mg | Freq: Every day | RECTAL | Status: DC | PRN

## 2016-07-22 MED ORDER — BUPIVACAINE-EPINEPHRINE (PF) 0.5% -1:200000 IJ SOLN
INTRAMUSCULAR | Status: DC | PRN
  Administered 2016-07-22: 15 mL via PERINEURAL

## 2016-07-22 MED ORDER — BUPIVACAINE-EPINEPHRINE (PF) 0.5% -1:200000 IJ SOLN
INTRAMUSCULAR | Status: AC
  Filled 2016-07-22: qty 30

## 2016-07-22 MED ORDER — LORATADINE 10 MG PO TABS: 10.0000 mg | ORAL_TABLET | Freq: Every day | ORAL | Status: DC | PRN

## 2016-07-22 MED ORDER — LIDOCAINE HCL (PF) 1 % IJ SOLN
INTRAMUSCULAR | Status: AC
  Filled 2016-07-22: qty 5

## 2016-07-22 MED ORDER — ONDANSETRON HCL 4 MG/2ML IJ SOLN
4.0000 mg | Freq: Once | INTRAMUSCULAR | Status: AC | PRN
  Administered 2016-07-22: 4 mg via INTRAVENOUS
  Filled 2016-07-22: qty 2

## 2016-07-22 MED ORDER — 0.9 % SODIUM CHLORIDE (POUR BTL) OPTIME
TOPICAL | Status: DC | PRN
  Administered 2016-07-22: 1000 mL

## 2016-07-22 MED ORDER — ONDANSETRON HCL 4 MG/2ML IJ SOLN
4.0000 mg | Freq: Once | INTRAMUSCULAR | Status: AC
  Administered 2016-07-22: 4 mg via INTRAVENOUS

## 2016-07-22 MED ORDER — NEOSTIGMINE METHYLSULFATE 10 MG/10ML IV SOLN
INTRAVENOUS | Status: DC | PRN
  Administered 2016-07-22: 3 mg via INTRAVENOUS

## 2016-07-22 MED ORDER — ROCURONIUM BROMIDE 50 MG/5ML IV SOLN
INTRAVENOUS | Status: AC
  Filled 2016-07-22: qty 1

## 2016-07-22 MED ORDER — HYDROMORPHONE HCL 1 MG/ML IJ SOLN
1.0000 mg | INTRAMUSCULAR | Status: DC | PRN
  Administered 2016-07-22: 2 mg via INTRAVENOUS
  Administered 2016-07-22 – 2016-07-23 (×4): 1 mg via INTRAVENOUS
  Administered 2016-07-23: 2 mg via INTRAVENOUS
  Filled 2016-07-22 (×3): qty 2
  Filled 2016-07-22 (×2): qty 1

## 2016-07-22 MED ORDER — DIPHENHYDRAMINE HCL 25 MG PO CAPS
25.0000 mg | ORAL_CAPSULE | ORAL | Status: DC | PRN
  Administered 2016-07-22 – 2016-07-23 (×4): 25 mg via ORAL
  Filled 2016-07-22 (×4): qty 1

## 2016-07-22 MED ORDER — ZOLPIDEM TARTRATE 5 MG PO TABS: 5.0000 mg | ORAL_TABLET | Freq: Every evening | ORAL | Status: DC | PRN

## 2016-07-22 MED ORDER — PROPOFOL 10 MG/ML IV BOLUS
INTRAVENOUS | Status: AC
  Filled 2016-07-22: qty 20

## 2016-07-22 MED ORDER — CEFAZOLIN IN D5W 1 GM/50ML IV SOLN
1.0000 g | Freq: Three times a day (TID) | INTRAVENOUS | Status: AC
  Administered 2016-07-22 – 2016-07-23 (×3): 1 g via INTRAVENOUS
  Filled 2016-07-22 (×3): qty 50

## 2016-07-22 MED ORDER — GLYCOPYRROLATE 0.2 MG/ML IJ SOLN
INTRAMUSCULAR | Status: DC | PRN
  Administered 2016-07-22: 0.4 mg via INTRAVENOUS

## 2016-07-22 MED ORDER — FENTANYL CITRATE (PF) 100 MCG/2ML IJ SOLN
INTRAMUSCULAR | Status: DC | PRN
  Administered 2016-07-22: 25 ug via INTRAVENOUS
  Administered 2016-07-22 (×3): 50 ug via INTRAVENOUS
  Administered 2016-07-22: 25 ug via INTRAVENOUS
  Administered 2016-07-22: 50 ug via INTRAVENOUS
  Administered 2016-07-22: 25 ug via INTRAVENOUS
  Administered 2016-07-22: 50 ug via INTRAVENOUS

## 2016-07-22 MED ORDER — SENNOSIDES-DOCUSATE SODIUM 8.6-50 MG PO TABS: 1.0000 | ORAL_TABLET | Freq: Every evening | ORAL | Status: DC | PRN

## 2016-07-22 MED ORDER — BUPIVACAINE LIPOSOME 1.3 % IJ SUSP
INTRAMUSCULAR | Status: AC
  Filled 2016-07-22: qty 20

## 2016-07-22 MED ORDER — BUPIVACAINE LIPOSOME 1.3 % IJ SUSP
20.0000 mL | Freq: Once | INTRAMUSCULAR | Status: DC
  Filled 2016-07-22: qty 20

## 2016-07-22 MED ORDER — GLYCOPYRROLATE 0.2 MG/ML IJ SOLN
INTRAMUSCULAR | Status: AC
  Filled 2016-07-22: qty 2

## 2016-07-22 MED ORDER — MIDAZOLAM HCL 2 MG/2ML IJ SOLN
INTRAMUSCULAR | Status: AC
  Filled 2016-07-22: qty 2

## 2016-07-22 MED ORDER — NEOSTIGMINE METHYLSULFATE 10 MG/10ML IV SOLN
INTRAVENOUS | Status: AC
  Filled 2016-07-22: qty 1

## 2016-07-22 MED ORDER — FENTANYL CITRATE (PF) 100 MCG/2ML IJ SOLN
25.0000 ug | INTRAMUSCULAR | Status: DC | PRN
  Administered 2016-07-22 (×2): 50 ug via INTRAVENOUS
  Filled 2016-07-22: qty 2

## 2016-07-22 SURGICAL SUPPLY — 80 items
APPLIER CLIP 13 LRG OPEN (CLIP)
APPLIER CLIP 5 13 M/L LIGAMAX5 (MISCELLANEOUS) ×5
APPLIER CLIP ROT 10 11.4 M/L (STAPLE) ×5
APR CLP LRG 13 20 CLIP (CLIP)
APR CLP MED LRG 11.4X10 (STAPLE) ×3
APR CLP MED LRG 5 ANG JAW (MISCELLANEOUS) ×3
BAG HAMPER (MISCELLANEOUS) ×5 IMPLANT
BAG SPEC RTRVL LRG 6X4 10 (ENDOMECHANICALS)
BLADE 11 SAFETY STRL DISP (BLADE) ×5 IMPLANT
BLADE SURG SZ11 CARB STEEL (BLADE) ×5 IMPLANT
CATH ROBINSON RED A/P 16FR (CATHETERS) ×3 IMPLANT
CLIP APPLIE 13 LRG OPEN (CLIP) IMPLANT
CLIP APPLIE 5 13 M/L LIGAMAX5 (MISCELLANEOUS) ×1 IMPLANT
CLIP APPLIE ROT 10 11.4 M/L (STAPLE) ×3 IMPLANT
CLOTH BEACON ORANGE TIMEOUT ST (SAFETY) ×5 IMPLANT
COVER LIGHT HANDLE STERIS (MISCELLANEOUS) ×10 IMPLANT
DECANTER SPIKE VIAL GLASS SM (MISCELLANEOUS) ×5 IMPLANT
DRAPE PROXIMA HALF (DRAPES) ×5 IMPLANT
DRAPE STERI URO 9X17 APER PCH (DRAPES) ×5 IMPLANT
ELECT REM PT RETURN 9FT ADLT (ELECTROSURGICAL) ×5
ELECTRODE REM PT RTRN 9FT ADLT (ELECTROSURGICAL) ×3 IMPLANT
FILTER SMOKE EVAC LAPAROSHD (FILTER) ×5 IMPLANT
FORMALIN 10 PREFIL 120ML (MISCELLANEOUS) ×3 IMPLANT
FORMALIN 10 PREFIL 480ML (MISCELLANEOUS) ×5 IMPLANT
GAUZE PACKING 2X5 YD STRL (GAUZE/BANDAGES/DRESSINGS) IMPLANT
GAUZE SPONGE 4X4 16PLY XRAY LF (GAUZE/BANDAGES/DRESSINGS) ×3 IMPLANT
GLOVE BIOGEL PI IND STRL 7.0 (GLOVE) ×4 IMPLANT
GLOVE BIOGEL PI IND STRL 8 (GLOVE) ×4 IMPLANT
GLOVE BIOGEL PI INDICATOR 7.0 (GLOVE) ×4
GLOVE BIOGEL PI INDICATOR 8 (GLOVE) ×4
GLOVE ECLIPSE 6.5 STRL STRAW (GLOVE) ×12 IMPLANT
GLOVE ECLIPSE 8.0 STRL XLNG CF (GLOVE) ×8 IMPLANT
GLOVE EXAM NITRILE PF MED BLUE (GLOVE) ×9 IMPLANT
GOWN STRL REUS W/TWL LRG LVL3 (GOWN DISPOSABLE) ×13 IMPLANT
GOWN STRL REUS W/TWL XL LVL3 (GOWN DISPOSABLE) ×8 IMPLANT
INST SET LAPROSCOPIC GYN AP (KITS) ×5 IMPLANT
IV NS IRRIG 3000ML ARTHROMATIC (IV SOLUTION) ×8 IMPLANT
KIT BLADEGUARD II DBL (SET/KITS/TRAYS/PACK) ×3 IMPLANT
KIT ROOM TURNOVER AP CYSTO (KITS) ×5 IMPLANT
KIT ROOM TURNOVER APOR (KITS) ×5 IMPLANT
MANIFOLD NEPTUNE II (INSTRUMENTS) ×5 IMPLANT
NDL HYPO 18GX1.5 BLUNT FILL (NEEDLE) ×2 IMPLANT
NDL HYPO 25X1 1.5 SAFETY (NEEDLE) ×2 IMPLANT
NEEDLE HYPO 18GX1.5 BLUNT FILL (NEEDLE) ×5 IMPLANT
NEEDLE HYPO 22GX1.5 SAFETY (NEEDLE) ×5 IMPLANT
NEEDLE HYPO 25X1 1.5 SAFETY (NEEDLE) ×5 IMPLANT
NEEDLE INSUFFLATION 120MM (ENDOMECHANICALS) ×5 IMPLANT
NS IRRIG 1000ML POUR BTL (IV SOLUTION) ×5 IMPLANT
PACK PERI GYN (CUSTOM PROCEDURE TRAY) ×5 IMPLANT
PAD ARMBOARD 7.5X6 YLW CONV (MISCELLANEOUS) ×5 IMPLANT
POUCH SPECIMEN RETRIEVAL 10MM (ENDOMECHANICALS) IMPLANT
SET BASIN LINEN APH (SET/KITS/TRAYS/PACK) ×5 IMPLANT
SET TUBE IRRIG SUCTION NO TIP (IRRIGATION / IRRIGATOR) ×3 IMPLANT
SHEARS HARMONIC ACE PLUS 36CM (ENDOMECHANICALS) ×5 IMPLANT
SHEET LAVH (DRAPES) ×3 IMPLANT
SLEEVE ENDOPATH XCEL 5M (ENDOMECHANICALS) ×5 IMPLANT
SOLUTION ANTI FOG 6CC (MISCELLANEOUS) ×5 IMPLANT
SPONGE GAUZE 2X2 8PLY STER LF (GAUZE/BANDAGES/DRESSINGS) ×3
SPONGE GAUZE 2X2 8PLY STRL LF (GAUZE/BANDAGES/DRESSINGS) ×12 IMPLANT
STAPLER VISISTAT 35W (STAPLE) ×5 IMPLANT
SUT MNCRL+ AB 3-0 CT1 36 (SUTURE) IMPLANT
SUT MON AB 3-0 SH 27 (SUTURE) ×3 IMPLANT
SUT MONOCRYL AB 3-0 CT1 36IN (SUTURE)
SUT VIC AB 0 CT1 27 (SUTURE) ×10
SUT VIC AB 0 CT1 27XCR 8 STRN (SUTURE) ×6 IMPLANT
SUT VICRYL 0 UR6 27IN ABS (SUTURE) ×5 IMPLANT
SYR 20CC LL (SYRINGE) ×5 IMPLANT
SYR CONTROL 10ML LL (SYRINGE) ×5 IMPLANT
SYRINGE 10CC LL (SYRINGE) ×5 IMPLANT
TAPE CLOTH SURG 4X10 WHT LF (GAUZE/BANDAGES/DRESSINGS) ×3 IMPLANT
TOWEL OR 17X26 4PK STRL BLUE (TOWEL DISPOSABLE) ×3 IMPLANT
TRAY FOLEY CATH SILVER 16FR (SET/KITS/TRAYS/PACK) ×5 IMPLANT
TROCAR ENDO BLADELESS 11MM (ENDOMECHANICALS) ×5 IMPLANT
TROCAR XCEL NON-BLD 11X100MML (ENDOMECHANICALS) ×5 IMPLANT
TROCAR XCEL NON-BLD 5MMX100MML (ENDOMECHANICALS) ×5 IMPLANT
TROCAR XCEL UNIV SLVE 11M 100M (ENDOMECHANICALS) ×5 IMPLANT
TUBING HI FLO HEAT INSUFFLATOR (IRRIGATION / IRRIGATOR) ×5 IMPLANT
TUBING INSUF HEATED (TUBING) ×5 IMPLANT
VERSALIGHT (MISCELLANEOUS) ×5 IMPLANT
WARMER LAPAROSCOPE (MISCELLANEOUS) ×5 IMPLANT

## 2016-07-22 NOTE — Transfer of Care (Signed)
Immediate Anesthesia Transfer of Care Note  Patient: Desiree Pope  Procedure(s) Performed: Procedure(s): LAPAROSCOPIC RIGHT SALPINGECTOMY (Right) LAPAROSCOPIC ASSISTED VAGINAL HYSTERECTOMY LEFT WITH SALPINGO OOPHORECTOMY (Left)  Patient Location: PACU  Anesthesia Type:General  Level of Consciousness: awake and patient cooperative  Airway & Oxygen Therapy: Patient Spontanous Breathing and non-rebreather face mask  Post-op Assessment: Report given to RN, Post -op Vital signs reviewed and stable and Patient moving all extremities  Post vital signs: Reviewed and stable  Last Vitals:  Vitals:   07/22/16 0830 07/22/16 0835  BP: 116/84 128/82  Pulse:    Resp: 20 (!) 0  Temp:      Last Pain:  Vitals:   07/22/16 0734  TempSrc: Oral         Complications: No apparent anesthesia complications

## 2016-07-22 NOTE — H&P (Signed)
Progress Notes Unsigned  Encounter Date: 07/07/2016 Florian Buff, MD  Obstetrics/Gynecology  Expand All Collapse All   Patient ID: Desiree Pope, female   DOB: December 26, 1981, 35 y.o.   MRN: [id number] Preoperative History and Physical  Desiree Pope is a 35 y.o. No obstetric history on file. with Patient's last menstrual period was 07/03/2016. admitted for a laparoscopic removal of an enlarging left ovarian mass, elective salpingectomy for ovarian cancer prophylaxis and a vaginal hysterectomy due to dysmenorrhea and bump dyspareunia, 100%.  Pt has had long standing dysmenorrhea and dyspareunia, unresponsive to hormonal control  She also has a persistent left ovarian mass which has had interval enlargement on scans with minimally elevated CA 125 of 43.4 She has had a tubal ligation in the past She desires to preserve her right ovary  PMH:         Past Medical History  Diagnosis Date  . Hx of chlamydia infection   . Pericarditis     age 89    PSH:          Past Surgical History  Procedure Laterality Date  . Tubal ligation    . Cardiac surgery      POb/GynH:         OB History    No data available      SH:       Social History  Substance Use Topics  . Smoking status: Current Every Day Smoker  . Smokeless tobacco: None  . Alcohol Use: Yes    FH:         Family History  Problem Relation Age of Onset  . Hypertension Other   . Cancer Other   . Diabetes Other   . Thyroid disease Other      Allergies:      Allergies  Allergen Reactions  . Iodine Anaphylaxis  . Motrin [Ibuprofen]     Medications:       Current outpatient prescriptions:  .  loratadine (CLARITIN) 10 MG tablet, Take 10 mg by mouth daily as needed for allergies., Disp: , Rfl:  .  oxyCODONE-acetaminophen (PERCOCET) 5-325 MG tablet, Take 1-2 tablets by mouth every 4 (four) hours as needed., Disp: 20 tablet, Rfl: 0 .  oxyCODONE-acetaminophen (PERCOCET/ROXICET) 5-325  MG tablet, Take 1 tablet by mouth every 4 (four) hours as needed., Disp: 15 tablet, Rfl: 0 .  promethazine (PHENERGAN) 25 MG tablet, Take 1 tablet (25 mg total) by mouth every 6 (six) hours as needed., Disp: 10 tablet, Rfl: 0 .  Acetaminophen-Caff-Pyrilamine (MIDOL COMPLETE PO), Take 1 tablet by mouth daily as needed (pain). Reported on 07/07/2016, Disp: , Rfl:   Review of Systems:   Review of Systems  Constitutional: Negative for fever, chills, weight loss, malaise/fatigue and diaphoresis.  HENT: Negative for hearing loss, ear pain, nosebleeds, congestion, sore throat, neck pain, tinnitus and ear discharge.   Eyes: Negative for blurred vision, double vision, photophobia, pain, discharge and redness.  Respiratory: Negative for cough, hemoptysis, sputum production, shortness of breath, wheezing and stridor.   Cardiovascular: Negative for chest pain, palpitations, orthopnea, claudication, leg swelling and PND.  Gastrointestinal: Positive for abdominal pain. Negative for heartburn, nausea, vomiting, diarrhea, constipation, blood in stool and melena.  Genitourinary: Negative for dysuria, urgency, frequency, hematuria and flank pain.  Musculoskeletal: Negative for myalgias, back pain, joint pain and falls.  Skin: Negative for itching and rash.  Neurological: Negative for dizziness, tingling, tremors, sensory change, speech change, focal weakness, seizures, loss of consciousness, weakness and  headaches.  Endo/Heme/Allergies: Negative for environmental allergies and polydipsia. Does not bruise/bleed easily.  Psychiatric/Behavioral: Negative for depression, suicidal ideas, hallucinations, memory loss and substance abuse. The patient is not nervous/anxious and does not have insomnia.      PHYSICAL EXAM:  Blood pressure 110/70, pulse 60, height 5\' 4"  (1.626 m), weight 125 lb (56.7 kg), last menstrual period 07/03/2016.    Vitals reviewed. Constitutional: She is oriented to person, place, and  time. She appears well-developed and well-nourished.  HENT:  Head: Normocephalic and atraumatic.  Right Ear: External ear normal.  Left Ear: External ear normal.  Nose: Nose normal.  Mouth/Throat: Oropharynx is clear and moist.  Eyes: Conjunctivae and EOM are normal. Pupils are equal, round, and reactive to light. Right eye exhibits no discharge. Left eye exhibits no discharge. No scleral icterus.  Neck: Normal range of motion. Neck supple. No tracheal deviation present. No thyromegaly present.  Cardiovascular: Normal rate, regular rhythm, normal heart sounds and intact distal pulses.  Exam reveals no gallop and no friction rub.   No murmur heard. Respiratory: Effort normal and breath sounds normal. No respiratory distress. She has no wheezes. She has no rales. She exhibits no tenderness.  GI: Soft. Bowel sounds are normal. She exhibits no distension and no mass. There is tenderness. There is no rebound and no guarding.  Genitourinary:       Vulva is normal without lesions Vagina is pink moist without discharge Cervix normal in appearance and pap is normal Uterus is normal size shape with tenderness on examto cervical movement and uterine palpation Adnexa is per sonogram tender Musculoskeletal: Normal range of motion. She exhibits no edema and no tenderness.  Neurological: She is alert and oriented to person, place, and time. She has normal reflexes. She displays normal reflexes. No cranial nerve deficit. She exhibits normal muscle tone. Coordination normal.  Skin: Skin is warm and dry. No rash noted. No erythema. No pallor.  Psychiatric: She has a normal mood and affect. Her behavior is normal. Judgment and thought content normal.    Labs:      Results for orders placed or performed during the hospital encounter of 07/04/16 (from the past 336 hour(s))  GC/Chlamydia probe amp   Collection Time: 07/04/16 12:00 AM  Result Value Ref Range   Chlamydia Negative    Neisseria gonorrhea  Negative   CBC with Differential   Collection Time: 07/04/16  4:50 AM  Result Value Ref Range   WBC 8.2 4.0 - 10.5 K/uL   RBC 4.26 3.87 - 5.11 MIL/uL   Hemoglobin 12.4 12.0 - 15.0 g/dL   HCT 37.3 36.0 - 46.0 %   MCV 87.6 78.0 - 100.0 fL   MCH 29.1 26.0 - 34.0 pg   MCHC 33.2 30.0 - 36.0 g/dL   RDW 13.4 11.5 - 15.5 %   Platelets 262 150 - 400 K/uL   Neutrophils Relative % 70 %   Neutro Abs 5.7 1.7 - 7.7 K/uL   Lymphocytes Relative 21 %   Lymphs Abs 1.7 0.7 - 4.0 K/uL   Monocytes Relative 7 %   Monocytes Absolute 0.6 0.1 - 1.0 K/uL   Eosinophils Relative 2 %   Eosinophils Absolute 0.2 0.0 - 0.7 K/uL   Basophils Relative 0 %   Basophils Absolute 0.0 0.0 - 0.1 K/uL  Urinalysis, Routine w reflex microscopic   Collection Time: 07/04/16  4:50 AM  Result Value Ref Range   Color, Urine YELLOW YELLOW   APPearance HAZY (A) CLEAR  Specific Gravity, Urine 1.020 1.005 - 1.030   pH 7.0 5.0 - 8.0   Glucose, UA NEGATIVE NEGATIVE mg/dL   Hgb urine dipstick MODERATE (A) NEGATIVE   Bilirubin Urine NEGATIVE NEGATIVE   Ketones, ur NEGATIVE NEGATIVE mg/dL   Protein, ur NEGATIVE NEGATIVE mg/dL   Nitrite NEGATIVE NEGATIVE   Leukocytes, UA NEGATIVE NEGATIVE  Basic metabolic panel   Collection Time: 07/04/16  4:50 AM  Result Value Ref Range   Sodium 136 135 - 145 mmol/L   Potassium 3.8 3.5 - 5.1 mmol/L   Chloride 108 101 - 111 mmol/L   CO2 24 22 - 32 mmol/L   Glucose, Bld 107 (Pope) 65 - 99 mg/dL   BUN 14 6 - 20 mg/dL   Creatinine, Ser 0.80 0.44 - 1.00 mg/dL   Calcium 8.5 (L) 8.9 - 10.3 mg/dL   GFR calc non Af Amer >60 >60 mL/min   GFR calc Af Amer >60 >60 mL/min   Anion gap 4 (L) 5 - 15  RPR   Collection Time: 07/04/16  4:50 AM  Result Value Ref Range   RPR Ser Ql Non Reactive Non Reactive  HIV antibody   Collection Time: 07/04/16  4:50 AM  Result Value Ref Range   HIV Screen 4th Generation wRfx Non Reactive Non Reactive  Urine  microscopic-add on   Collection Time: 07/04/16  4:50 AM  Result Value Ref Range   Squamous Epithelial / LPF 0-5 (A) NONE SEEN   WBC, UA 0-5 0 - 5 WBC/hpf   RBC / HPF 6-30 0 - 5 RBC/hpf   Bacteria, UA FEW (A) NONE SEEN   Urine-Other AMORPHOUS URATES/PHOSPHATES   POC urine preg, ED   Collection Time: 07/04/16  5:04 AM  Result Value Ref Range   Preg Test, Ur NEGATIVE NEGATIVE  Wet prep, genital   Collection Time: 07/04/16  5:35 AM  Result Value Ref Range   Yeast Wet Prep HPF POC NONE SEEN NONE SEEN   Trich, Wet Prep NONE SEEN NONE SEEN   Clue Cells Wet Prep HPF POC NONE SEEN NONE SEEN   WBC, Wet Prep HPF POC MODERATE (A) NONE SEEN   Sperm NONE SEEN   CA 125   Collection Time: 07/04/16  1:00 PM  Result Value Ref Range   CA 125 43.4 (Pope) 0.0 - 38.1 U/mL    EKG:    Orders placed or performed during the hospital encounter of 06/13/11  . EKG  . EKG    Imaging Studies:  ImagingResults  US Transvaginal Non-ob  07/04/2016  CLINICAL DATA:  35 year old female with bilateral pelvic pain for 1 day. LMP 07/03/2016. History of tubal ligation. EXAM: TRANSABDOMINAL AND TRANSVAGINAL ULTRASOUND OF PELVIS TECHNIQUE: Both transabdominal and transvaginal ultrasound examinations of the pelvis were performed. Transabdominal technique was performed for global imaging of the pelvis including uterus, ovaries, adnexal regions, and pelvic cul-de-sac. It was necessary to proceed with endovaginal exam following the transabdominal exam to visualize the endometrium and adnexa. COMPARISON:  02/18/2010 pelvic sonogram. FINDINGS: Uterus Measurements: 8.0 x 4.6 x 6.1 cm. Retroverted retroflexed uterus is mildly enlarged and globular in conformation. Diffuse myometrial heterogeneity with refractory myometrial shadowing and indistinct endometrial myometrial interface. Findings suggest diffuse adenomyosis of the uterus. No uterine fibroids or other myometrial abnormalities. Endometrium Thickness: 5  mm. No endometrial cavity fluid or focal endometrial mass demonstrated. Right ovary Measurements: 3.3 x 3.0 x 1.9 cm. Normal appearance/no adnexal mass. Left ovary Measurements: 4.9 x 6.3 x 3.3 cm. There is  a 4.1 x 1.8 x 2.4 cm left ovarian mass with homogeneous hyperechogenicity and no internal vascularity, which may represent interval growth of the similar-appearing 2.3 x 2.3 x 1.9 cm left ovarian mass on the 02/18/2010 sonogram. No additional left ovarian or left adnexal mass. Other findings No abnormal free fluid. IMPRESSION: 1. Diffuse adenomyosis of the uterus.  No uterine fibroids. 2. No focal endometrial abnormality. 3. Homogeneously hyperechoic 4.1 cm left ovarian mass without internal vascularity, probably representing growth of the previously described similar appearing 2.3 cm left ovarian mass on the 02/17/2010 sonogram, favor a left ovarian endometrioma based on the results of the 02/19/2010 pelvic MRI. Continued sonographic follow-up is advised if this mass is not resected. Electronically Signed   By: Ilona Sorrel M.D.   On: 07/04/2016 10:10   US Pelvis Complete  07/04/2016  CLINICAL DATA:  35 year old female with bilateral pelvic pain for 1 day. LMP 07/03/2016. History of tubal ligation. EXAM: TRANSABDOMINAL AND TRANSVAGINAL ULTRASOUND OF PELVIS TECHNIQUE: Both transabdominal and transvaginal ultrasound examinations of the pelvis were performed. Transabdominal technique was performed for global imaging of the pelvis including uterus, ovaries, adnexal regions, and pelvic cul-de-sac. It was necessary to proceed with endovaginal exam following the transabdominal exam to visualize the endometrium and adnexa. COMPARISON:  02/18/2010 pelvic sonogram. FINDINGS: Uterus Measurements: 8.0 x 4.6 x 6.1 cm. Retroverted retroflexed uterus is mildly enlarged and globular in conformation. Diffuse myometrial heterogeneity with refractory myometrial shadowing and indistinct endometrial myometrial interface.  Findings suggest diffuse adenomyosis of the uterus. No uterine fibroids or other myometrial abnormalities. Endometrium Thickness: 5 mm. No endometrial cavity fluid or focal endometrial mass demonstrated. Right ovary Measurements: 3.3 x 3.0 x 1.9 cm. Normal appearance/no adnexal mass. Left ovary Measurements: 4.9 x 6.3 x 3.3 cm. There is a 4.1 x 1.8 x 2.4 cm left ovarian mass with homogeneous hyperechogenicity and no internal vascularity, which may represent interval growth of the similar-appearing 2.3 x 2.3 x 1.9 cm left ovarian mass on the 02/18/2010 sonogram. No additional left ovarian or left adnexal mass. Other findings No abnormal free fluid. IMPRESSION: 1. Diffuse adenomyosis of the uterus.  No uterine fibroids. 2. No focal endometrial abnormality. 3. Homogeneously hyperechoic 4.1 cm left ovarian mass without internal vascularity, probably representing growth of the previously described similar appearing 2.3 cm left ovarian mass on the 02/17/2010 sonogram, favor a left ovarian endometrioma based on the results of the 02/19/2010 pelvic MRI. Continued sonographic follow-up is advised if this mass is not resected. Electronically Signed   By: Ilona Sorrel M.D.   On: 07/04/2016 10:10      Results for orders placed or performed during the hospital encounter of 07/22/16 (from the past 336 hour(s))  Glucose, capillary   Collection Time: 07/22/16  8:01 AM  Result Value Ref Range   Glucose-Capillary 79 65 - 99 mg/dL  Results for orders placed or performed during the hospital encounter of 07/20/16 (from the past 336 hour(s))  CBC   Collection Time: 07/20/16  1:05 PM  Result Value Ref Range   WBC 6.2 4.0 - 10.5 K/uL   RBC 4.22 3.87 - 5.11 MIL/uL   Hemoglobin 12.7 12.0 - 15.0 g/dL   HCT 37.4 36.0 - 46.0 %   MCV 88.6 78.0 - 100.0 fL   MCH 30.1 26.0 - 34.0 pg   MCHC 34.0 30.0 - 36.0 g/dL   RDW 13.2 11.5 - 15.5 %   Platelets 301 150 - 400 K/uL  Comprehensive metabolic panel   Collection Time:  07/20/16  1:05 PM  Result Value Ref Range   Sodium 138 135 - 145 mmol/L   Potassium 4.5 3.5 - 5.1 mmol/L   Chloride 107 101 - 111 mmol/L   CO2 25 22 - 32 mmol/L   Glucose, Bld 49 (L) 65 - 99 mg/dL   BUN 14 6 - 20 mg/dL   Creatinine, Ser 0.79 0.44 - 1.00 mg/dL   Calcium 8.9 8.9 - 10.3 mg/dL   Total Protein 6.8 6.5 - 8.1 g/dL   Albumin 4.0 3.5 - 5.0 g/dL   AST 16 15 - 41 U/L   ALT 13 (L) 14 - 54 U/L   Alkaline Phosphatase 70 38 - 126 U/L   Total Bilirubin 0.4 0.3 - 1.2 mg/dL   GFR calc non Af Amer >60 >60 mL/min   GFR calc Af Amer >60 >60 mL/min   Anion gap 6 5 - 15  hCG, quantitative, pregnancy   Collection Time: 07/20/16  1:05 PM  Result Value Ref Range   hCG, Beta Chain, Quant, S <1 <5 mIU/mL  Urinalysis, Routine w reflex microscopic (not at [facility])   Collection Time: 07/20/16  1:05 PM  Result Value Ref Range   Color, Urine YELLOW YELLOW   APPearance CLEAR CLEAR   Specific Gravity, Urine 1.020 1.005 - 1.030   pH 6.0 5.0 - 8.0   Glucose, UA NEGATIVE NEGATIVE mg/dL   Hgb urine dipstick NEGATIVE NEGATIVE   Bilirubin Urine NEGATIVE NEGATIVE   Ketones, ur NEGATIVE NEGATIVE mg/dL   Protein, ur NEGATIVE NEGATIVE mg/dL   Nitrite NEGATIVE NEGATIVE   Leukocytes, UA NEGATIVE NEGATIVE  Type and screen   Collection Time: 07/20/16  1:05 PM  Result Value Ref Range   ABO/RH(D) O POS    Antibody Screen NEG    Sample Expiration 08/03/2016    Extend sample reason NO TRANSFUSIONS OR PREGNANCY IN THE PAST 3 MONTHS        Assessment: Ovarian mass, left, interval enlargement(minimally elevated CA 125 43.4)  Dyspareunia, female  Dysmenorrhea       Patient Active Problem List   Diagnosis Date Noted  . Hx of chlamydia infection 04/12/2013    Plan: Laparoscopic left salpingo oophorectomy and right salpingectomy with vaginal hysterectomy 07/22/2016  Desiree Pope 07/07/2016 2:39 PM        Florian Buff, MD 07/22/2016 8:15 AM

## 2016-07-22 NOTE — Progress Notes (Signed)
Patient complains of itching and pressure in abdomen. Foley 400c of urine to count. Bladder scan showed 200cc. Dr. Elonda Husky notified. New order for Benadryl every 4 hours as needed. Discontinue Foley.

## 2016-07-22 NOTE — Op Note (Signed)
Preoperative diagnosis:  1.  Left ovarian mass, complex                                         2.  Dysmenorrhea                                         3.  Dyspareunia                                         4.  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 Endotracheal   Findings:  Intraoperatively the patient had an endometrioma on the left ovary with a significant amount of endometriosis on both pelvic sidewalls and the cul-de-sac.  Interestingly the right ovary seemed to be free of any endometriosis lesions.  The uterus was otherwise normal  Description of operation: Patient was taken to the operating room placed in the supine position where she underwent general endotracheal anesthesia She was placed in dorsal lithotomy position and prepped and draped in the usual sterile fashion for laparoscopic procedure A Foley catheter was placed An incision was made in the umbilicus and dissection taken down to the fascia The fascia was then clamped with a Coker and a varies needle was placed into the peritoneal cavity one pass that difficulty The abdomen was insufflated Under direct visualization 5 mm trochars were placed in the right and left lower quadrants bilaterally after stab incisions were made The above-noted findings were seen As per the preoperative plan the harmonic scalpel was used and the left infundibulopelvic ligament was transected with good hemostasis The left utero-ovarian ligament was then transected using the Harmonic scalpel The left round ligament was then transected using the Harmonic scalpel Bladder flap was created in the left using the Harmonic scalpel The right round ligament was transected using the Harmonic  scalpel The utero-ovarian ligament on the right was transected using the Harmonic scalpel The bladder flap on the right was created using the Harmonic scalpel and it was joined to the bladder flap was created on the left The right fallopian tube was then removed using the Harmonic scalpel The uterine vessels were transected bilaterally using the Harmonic scalpel after hemoclips were used to back clamp the uterine artery anastomosis with the infundibulopelvic ligament The pedicles were hemostatic and at this point I turned my attention to the vaginal portion of the procedure The umbilical fascia was closed using 0 Vicryl The 3 skin incisions were closed using staples X per L was injected The skin incisions were dressed The drapes were then removed The patient was placed in candycane Stertz The patient was then reprepped and draped for a vaginal component of the LAVH  Half percent Marcaine was injected in circumferential fashion about the cervix The Bovie electrocautery unit was used and incision was made circumferentially about the cervix The anterior and posterior mucosa was pushed off the cervix The posterior cul-de-sac was entered sharply The uterosacral ligaments were clamped cut and transfixion suture ligated bilaterally  The cardinal ligaments were clamped cut and transfixion suture ligated bilaterally The anterior cul-de-sac was entered sharply without difficulty Only 1 more pedicle was required that pedicle was clamped cut and transfixion suture ligated The uterus was removed along with the left tube and ovary and the right fallopian tube  As stated previously and RD transected the uterine vasculature with the Harmonic scalpel during the laparoscopic component of the procedure  All pedicles were hemostatic  The peritoneum was closed in a pursestring fashion The anterior and posterior vagina were closed with figure-of-eight sutures with good resulting hemostasis and anatomical  repair  The patient was awakened from anesthesia she was taken covering good stable condition all counts are correct 3 She received preoperative antibiotics and Toradol  Florian Buff, MD

## 2016-07-22 NOTE — Anesthesia Preprocedure Evaluation (Signed)
Anesthesia Evaluation  Patient identified by MRN, date of birth, ID band Patient awake    Reviewed: Allergy & Precautions, NPO status , Patient's Chart, lab work & pertinent test results  Airway Mallampati: I  TM Distance: >3 FB     Dental  (+) Teeth Intact   Pulmonary Current Smoker,    breath sounds clear to auscultation       Cardiovascular  Rhythm:Regular Rate:Normal  Hx pericarditis    Neuro/Psych    GI/Hepatic negative GI ROS,   Endo/Other  negative endocrine ROS  Renal/GU      Musculoskeletal   Abdominal   Peds  Hematology   Anesthesia Other Findings   Reproductive/Obstetrics                            Anesthesia Physical Anesthesia Plan  ASA: II  Anesthesia Plan: General   Post-op Pain Management:    Induction: Intravenous  Airway Management Planned: Oral ETT  Additional Equipment:   Intra-op Plan:   Post-operative Plan: Extubation in OR  Informed Consent: I have reviewed the patients History and Physical, chart, labs and discussed the procedure including the risks, benefits and alternatives for the proposed anesthesia with the patient or authorized representative who has indicated his/her understanding and acceptance.     Plan Discussed with:   Anesthesia Plan Comments:         Anesthesia Quick Evaluation

## 2016-07-22 NOTE — Anesthesia Postprocedure Evaluation (Signed)
Anesthesia Post Note  Patient: Joanna Puff  Procedure(s) Performed: Procedure(s) (LRB): LAPAROSCOPIC RIGHT SALPINGECTOMY (Right) LAPAROSCOPIC ASSISTED VAGINAL HYSTERECTOMY LEFT WITH SALPINGO OOPHORECTOMY (Left)  Patient location during evaluation: PACU Anesthesia Type: General Level of consciousness: awake and alert Pain management: satisfactory to patient Vital Signs Assessment: post-procedure vital signs reviewed and stable Respiratory status: spontaneous breathing Cardiovascular status: stable Anesthetic complications: no    Last Vitals:  Vitals:   07/22/16 0835 07/22/16 1130  BP: 128/82   Pulse:    Resp: (!) 0   Temp:  36.4 C    Last Pain:  Vitals:   07/22/16 1130  TempSrc:   PainSc: 3                  Dameka Younker

## 2016-07-22 NOTE — Anesthesia Procedure Notes (Signed)
Procedure Name: Intubation Date/Time: 07/22/2016 8:54 AM Performed by: Vista Deck Pre-anesthesia Checklist: Patient identified, Patient being monitored, Timeout performed, Emergency Drugs available and Suction available Patient Re-evaluated:Patient Re-evaluated prior to inductionOxygen Delivery Method: Circle System Utilized Preoxygenation: Pre-oxygenation with 100% oxygen Intubation Type: IV induction Ventilation: Mask ventilation without difficulty Laryngoscope Size: Miller and 2 Grade View: Grade I Tube type: Oral Tube size: 7.0 mm Number of attempts: 1 Airway Equipment and Method: Stylet Placement Confirmation: ETT inserted through vocal cords under direct vision,  positive ETCO2 and breath sounds checked- equal and bilateral Secured at: 22 cm Tube secured with: Tape Dental Injury: Teeth and Oropharynx as per pre-operative assessment

## 2016-07-23 DIAGNOSIS — D252 Subserosal leiomyoma of uterus: Secondary | ICD-10-CM | POA: Diagnosis not present

## 2016-07-23 LAB — CBC
HCT: 35.4 % — ABNORMAL LOW (ref 36.0–46.0)
Hemoglobin: 11.9 g/dL — ABNORMAL LOW (ref 12.0–15.0)
MCH: 30 pg (ref 26.0–34.0)
MCHC: 33.6 g/dL (ref 30.0–36.0)
MCV: 89.2 fL (ref 78.0–100.0)
PLATELETS: 272 10*3/uL (ref 150–400)
RBC: 3.97 MIL/uL (ref 3.87–5.11)
RDW: 13.4 % (ref 11.5–15.5)
WBC: 8.1 10*3/uL (ref 4.0–10.5)

## 2016-07-23 LAB — BASIC METABOLIC PANEL
ANION GAP: 7 (ref 5–15)
BUN: 5 mg/dL — AB (ref 6–20)
CALCIUM: 8.7 mg/dL — AB (ref 8.9–10.3)
CO2: 27 mmol/L (ref 22–32)
CREATININE: 0.69 mg/dL (ref 0.44–1.00)
Chloride: 104 mmol/L (ref 101–111)
GLUCOSE: 101 mg/dL — AB (ref 65–99)
Potassium: 4.2 mmol/L (ref 3.5–5.1)
Sodium: 138 mmol/L (ref 135–145)

## 2016-07-23 MED ORDER — CIPROFLOXACIN HCL 500 MG PO TABS: 500.0000 mg | ORAL_TABLET | Freq: Two times a day (BID) | ORAL | 0 refills | Status: DC

## 2016-07-23 MED ORDER — OXYCODONE-ACETAMINOPHEN 5-325 MG PO TABS: 1.0000 | ORAL_TABLET | ORAL | 0 refills | Status: DC | PRN

## 2016-07-23 MED ORDER — ONDANSETRON HCL 8 MG PO TABS: 8.0000 mg | ORAL_TABLET | Freq: Four times a day (QID) | ORAL | 0 refills | Status: DC | PRN

## 2016-07-23 NOTE — Progress Notes (Signed)
Pt IV removed, tolerated well.  Reviewed discharge instructions with pt and answered all questions at this time.  

## 2016-07-23 NOTE — Discharge Summary (Signed)
Physician Discharge Summary  Patient ID: Desiree Pope MRN: [id number] DOB/AGE: 1981-03-15 35 y.o.  Admit date: 07/22/2016 Discharge date: 07/23/2016  Admission Diagnoses:  Discharge Diagnoses:  Active Problems:   S/P laparoscopic assisted vaginal hysterectomy (LAVH) with LSO and right salpingectomy  Discharged Condition: good  Hospital Course: unremarkable post operative course  Consults: None  Significant Diagnostic Studies: labs:   Treatments: surgery: LAV LSO right salpingectomy  Discharge Exam: Blood pressure 97/66, pulse 65, temperature 99.2 F (37.3 C), temperature source Oral, resp. rate 20, height 5\' 4"  (1.626 m), weight 118 lb (53.5 kg), last menstrual period 07/03/2016, SpO2 99 %. General appearance: alert, cooperative and no distress GI: soft, non-tender; bowel sounds normal; no masses,  no organomegaly Incision/Wound:clean dry intact x 3  Disposition: 01-Home or Self Care  Discharge Instructions    Call MD for:  persistant nausea and vomiting    Complete by:  As directed   Call MD for:  severe uncontrolled pain    Complete by:  As directed   Call MD for:  temperature >100.4    Complete by:  As directed   Diet - low sodium heart healthy    Complete by:  As directed   Driving Restrictions    Complete by:  As directed   Do no0t drive for 1 week   Increase activity slowly    Complete by:  As directed   Lifting restrictions    Complete by:  As directed   Do not lift more than 10 pounds for 6 weeks   Sexual Activity Restrictions    Complete by:  As directed   You're kidding.. Right?       Medication List    STOP taking these medications   BC HEADACHE POWDER PO   MIDOL COMPLETE PO   promethazine 25 MG tablet Commonly known as:  PHENERGAN     TAKE these medications   acetaminophen 500 MG tablet Commonly known as:  TYLENOL Take 1,000 mg by mouth every 6 (six) hours as needed for mild pain, moderate pain, fever or headache.   ciprofloxacin 500 MG  tablet Commonly known as:  CIPRO Take 1 tablet (500 mg total) by mouth 2 (two) times daily.   loratadine 10 MG tablet Commonly known as:  CLARITIN Take 10 mg by mouth daily as needed for allergies.   ondansetron 8 MG tablet Commonly known as:  ZOFRAN Take 1 tablet (8 mg total) by mouth every 6 (six) hours as needed for nausea or vomiting.   oxyCODONE-acetaminophen 5-325 MG tablet Commonly known as:  PERCOCET Take 1-2 tablets by mouth every 4 (four) hours as needed. What changed:  Another medication with the same name was added. Make sure you understand how and when to take each.   oxyCODONE-acetaminophen 5-325 MG tablet Commonly known as:  PERCOCET/ROXICET Take 1-2 tablets by mouth every 4 (four) hours as needed (moderate to severe pain (when tolerating fluids)). What changed:  You were already taking a medication with the same name, and this prescription was added. Make sure you understand how and when to take each.        SignedFlorian Buff 07/23/2016, 1:10 PM

## 2016-07-23 NOTE — Addendum Note (Signed)
Addendum  created 07/23/16 1258 by Charmaine Downs, CRNA   Sign clinical note

## 2016-07-23 NOTE — Discharge Instructions (Signed)

## 2016-07-23 NOTE — Anesthesia Postprocedure Evaluation (Signed)
Anesthesia Post Note  Patient: Desiree Pope  Procedure(s) Performed: Procedure(s) (LRB): LAPAROSCOPIC RIGHT SALPINGECTOMY (Right) LAPAROSCOPIC ASSISTED VAGINAL HYSTERECTOMY LEFT WITH SALPINGO OOPHORECTOMY (Left)  Patient location during evaluation: Nursing Unit Anesthesia Type: General Level of consciousness: awake and alert, oriented and patient cooperative Pain management: pain level controlled Vital Signs Assessment: post-procedure vital signs reviewed and stable Respiratory status: spontaneous breathing, nonlabored ventilation and respiratory function stable Cardiovascular status: blood pressure returned to baseline and stable Postop Assessment: adequate PO intake and no signs of nausea or vomiting Anesthetic complications: no    Last Vitals:  Vitals:   07/22/16 2137 07/23/16 0515  BP: 98/60 97/66  Pulse: (!) 58 65  Resp: 20 20  Temp: 36.8 C 37.3 C    Last Pain:  Vitals:   07/23/16 1232  TempSrc:   PainSc: 9                  Tazia Illescas J

## 2016-07-28 ENCOUNTER — Encounter (HOSPITAL_COMMUNITY): Payer: Self-pay | Admitting: Obstetrics & Gynecology

## 2016-07-29 ENCOUNTER — Telehealth: Payer: Self-pay | Admitting: Obstetrics & Gynecology

## 2016-07-29 NOTE — Telephone Encounter (Signed)
Needs to speak to a nurse about how she is still feeling since surgey/ has appt tomorrow but wants a call today from a nurse/amp

## 2016-07-29 NOTE — Telephone Encounter (Signed)
Pt states had surgery on 07/22/2016, had a fever on Monday, chills took tylenol and fever broke but has not felt well any day this week but yesterday. No drainage, redness or swelling at incision. Only way to have a bowel movement is with the suppository, last BM yesterday at 2 pm. Pt states she has her post op appt for tomorrow at 11 am with Dr.Eure.  Offered pt an appt today with Dr.Eure at 1:30 pm. Pt states she did not think she can get a ride today will just keep her appt for tomorrow.

## 2016-07-30 ENCOUNTER — Encounter: Payer: Self-pay | Admitting: Obstetrics & Gynecology

## 2016-07-30 ENCOUNTER — Ambulatory Visit (INDEPENDENT_AMBULATORY_CARE_PROVIDER_SITE_OTHER): Payer: BLUE CROSS/BLUE SHIELD | Admitting: Obstetrics & Gynecology

## 2016-07-30 ENCOUNTER — Telehealth: Payer: Self-pay | Admitting: *Deleted

## 2016-07-30 ENCOUNTER — Encounter: Payer: Self-pay | Admitting: *Deleted

## 2016-07-30 VITALS — BP 110/72 | Ht 64.0 in | Wt 117.0 lb

## 2016-07-30 DIAGNOSIS — N803 Endometriosis of pelvic peritoneum, unspecified: Secondary | ICD-10-CM

## 2016-07-30 DIAGNOSIS — N801 Endometriosis of ovary: Secondary | ICD-10-CM

## 2016-07-30 DIAGNOSIS — Z9889 Other specified postprocedural states: Secondary | ICD-10-CM

## 2016-07-30 DIAGNOSIS — N80109 Endometriosis of ovary, unspecified side, unspecified depth: Secondary | ICD-10-CM

## 2016-07-30 MED ORDER — VALACYCLOVIR HCL 1 G PO TABS: 1000.0000 mg | ORAL_TABLET | Freq: Two times a day (BID) | ORAL | 3 refills | Status: DC

## 2016-07-30 MED ORDER — LEUPROLIDE ACETATE (3 MONTH) 11.25 MG IM KIT: 11.2500 mg | PACK | INTRAMUSCULAR | 1 refills | Status: DC

## 2016-07-30 NOTE — Telephone Encounter (Signed)
Pt informed [phone] Rx to be processed and delivered to our office, will contact pt when received to schedule her injection. Pt verbalized understanding.

## 2016-07-30 NOTE — Progress Notes (Signed)
Blood pressure 110/72, height 5\' 4"  (1.626 m), weight 117 lb (53.1 kg), last menstrual period 07/03/2016.   HPI: Patient returns for routine postoperative follow-up having undergone LAVH LSO on 07/22/2016.  The patient's immediate postoperative recovery has been unremarkable. Since hospital discharge the patient reports no problems.   Current Outpatient Prescriptions: acetaminophen (TYLENOL) 500 MG tablet, Take 1,000 mg by mouth every 6 (six) hours as needed for mild pain, moderate pain, fever or headache., Disp: , Rfl:  diphenhydrAMINE (BENADRYL) 25 MG tablet, Take 25 mg by mouth every 6 (six) hours as needed., Disp: , Rfl:  docusate sodium (COLACE) 100 MG capsule, Take 100 mg by mouth 2 (two) times daily., Disp: , Rfl:  ondansetron (ZOFRAN) 8 MG tablet, Take 1 tablet (8 mg total) by mouth every 6 (six) hours as needed for nausea or vomiting., Disp: 20 tablet, Rfl: 0 Psyllium (METAMUCIL FIBER PO), Take by mouth., Disp: , Rfl:  leuprolide (LUPRON DEPOT, 51-MONTH,) 11.25 MG injection, Inject 11.25 mg into the muscle every 3 (three) months., Disp: 1 each, Rfl: 1 loratadine (CLARITIN) 10 MG tablet, Take 10 mg by mouth daily as needed for allergies., Disp: , Rfl:  valACYclovir (VALTREX) 1000 MG tablet, Take 1 tablet (1,000 mg total) by mouth 2 (two) times daily., Disp: 20 tablet, Rfl: 3  No current facility-administered medications for this visit.     Blood pressure 110/72, height 5\' 4"  (1.626 m), weight 117 lb (53.1 kg), last menstrual period 07/03/2016.  Physical Exam: Incision x3 clean dry intact  Diagnostic Tests: none  Pathology: Endometriosis, benign  Impression: S/P LAVH LSO with right salpingectomy  Plan: 6 months of Lupron  Follow up: 4  weeks  Florian Buff, MD

## 2016-08-12 ENCOUNTER — Telehealth: Payer: Self-pay | Admitting: *Deleted

## 2016-08-12 NOTE — Telephone Encounter (Signed)
Pt informed spoke with pharmacy today about Lupron injection they are going to expedite rx. Pt verbalized understanding.

## 2016-08-12 NOTE — Telephone Encounter (Signed)
Spoke with representative from [place] states will try to expedite RX approval for Lupron.   lmtrc x 1, let pt know status of Lupron.

## 2016-08-27 ENCOUNTER — Ambulatory Visit (INDEPENDENT_AMBULATORY_CARE_PROVIDER_SITE_OTHER): Payer: BLUE CROSS/BLUE SHIELD | Admitting: Obstetrics & Gynecology

## 2016-08-27 ENCOUNTER — Encounter: Payer: Self-pay | Admitting: Obstetrics & Gynecology

## 2016-08-27 VITALS — BP 80/60 | HR 78 | Ht 64.0 in | Wt 117.0 lb

## 2016-08-27 DIAGNOSIS — A499 Bacterial infection, unspecified: Secondary | ICD-10-CM

## 2016-08-27 DIAGNOSIS — N760 Acute vaginitis: Secondary | ICD-10-CM

## 2016-08-27 DIAGNOSIS — Z9889 Other specified postprocedural states: Secondary | ICD-10-CM

## 2016-08-27 DIAGNOSIS — N898 Other specified noninflammatory disorders of vagina: Secondary | ICD-10-CM | POA: Diagnosis not present

## 2016-08-27 DIAGNOSIS — N803 Endometriosis of pelvic peritoneum, unspecified: Secondary | ICD-10-CM

## 2016-08-27 DIAGNOSIS — B9689 Other specified bacterial agents as the cause of diseases classified elsewhere: Secondary | ICD-10-CM

## 2016-08-27 MED ORDER — ESTRADIOL 2 MG PO TABS: 2.0000 mg | ORAL_TABLET | Freq: Every day | ORAL | 6 refills | Status: DC

## 2016-08-27 MED ORDER — METRONIDAZOLE 0.75 % VA GEL: VAGINAL | 0 refills | Status: DC

## 2016-08-27 NOTE — Progress Notes (Signed)
  HPI: Patient returns for routine postoperative follow-up having undergone LAVH, LSO, right salpingectomy(the right ovary was preserved) on 07/22/16.  The patient's immediate postoperative recovery has been unremarkable. Since hospital discharge the patient reports minimal crampy type pain.   Current Outpatient Prescriptions: loratadine (CLARITIN) 10 MG tablet, Take 10 mg by mouth daily as needed for allergies., Disp: , Rfl:  acetaminophen (TYLENOL) 500 MG tablet, Take 1,000 mg by mouth every 6 (six) hours as needed for mild pain, moderate pain, fever or headache., Disp: , Rfl:  diphenhydrAMINE (BENADRYL) 25 MG tablet, Take 25 mg by mouth every 6 (six) hours as needed., Disp: , Rfl:   No current facility-administered medications for this visit.     Blood pressure (!) 80/60, pulse 78, height 5\' 4"  (1.626 m), weight 117 lb (53.1 kg), last menstrual period 07/03/2016.  Physical Exam: Incision x3 well healed Vagina with BV cuff still healing  Diagnostic Tests: none  Pathology: Benign, endometriosis  Impression: Pelvic peritoneal endometriosis  Post-operative state  BV (bacterial vaginosis)    Plan: Luproin 11.25 mg IM given today Lot #[id number], EXP 11/05/2018  Meds ordered this encounter  Medications  . metroNIDAZOLE (METROGEL VAGINAL) 0.75 % vaginal gel    Sig: Nightly x 5 nights    Dispense:  70 g    Refill:  0  . estradiol (ESTRACE) 2 MG tablet    Sig: Take 1 tablet (2 mg total) by mouth daily.    Dispense:  30 tablet    Refill:  6     Follow up: 3  months for next Lupron injection  Florian Buff, MD

## 2016-09-24 ENCOUNTER — Telehealth: Payer: Self-pay | Admitting: Obstetrics & Gynecology

## 2016-09-24 NOTE — Telephone Encounter (Signed)
Pt states since taking the Lupron shot/estradiol has lost a lot of weight. Is this normal?

## 2016-09-25 NOTE — Telephone Encounter (Signed)
Any abrupt change in hormones can prompt appetite and/or weight changes

## 2016-09-28 NOTE — Telephone Encounter (Signed)
Pt informed per Dr. Elonda Husky any abrupt change in hormones can prompt appetite and/or weight changes. Pt verbalized understanding.

## 2016-10-20 ENCOUNTER — Telehealth: Payer: Self-pay | Admitting: Obstetrics & Gynecology

## 2016-10-20 NOTE — Telephone Encounter (Signed)
Pt c/o headaches x 2 weeks, not sleeping can this be a side effect from the Lupron.

## 2016-10-20 NOTE — Telephone Encounter (Signed)
ummm headaches could be.    Sleeping could be  I will send 2 scripts in for a sleep aid and some fioricet andd see if that helps

## 2016-10-20 NOTE — Telephone Encounter (Signed)
Pt called stating that she would like a call back from Dr. Elonda Husky or his nurse regarding the injection LUPRON. Please contact pt

## 2016-10-21 ENCOUNTER — Other Ambulatory Visit: Payer: Self-pay | Admitting: Obstetrics & Gynecology

## 2016-10-21 MED ORDER — BUTALBITAL-APAP-CAFFEINE 50-325-40 MG PO TABS: 1.0000 | ORAL_TABLET | Freq: Four times a day (QID) | ORAL | 1 refills | Status: AC | PRN

## 2016-10-21 MED ORDER — ZOLPIDEM TARTRATE 10 MG PO TABS: 10.0000 mg | ORAL_TABLET | Freq: Every evening | ORAL | 3 refills | Status: DC | PRN

## 2016-10-21 NOTE — Telephone Encounter (Signed)
Thanks they have been signed and faxed

## 2016-10-21 NOTE — Telephone Encounter (Signed)
I know these will have to be signed and sent, I will come in before I go to [facility] to sign them

## 2016-10-21 NOTE — Telephone Encounter (Signed)
Pt states would like a Rx for Fioricet and sleep aid prescribed to [place] [place].

## 2016-11-26 ENCOUNTER — Encounter: Payer: Self-pay | Admitting: Obstetrics & Gynecology

## 2016-11-26 ENCOUNTER — Telehealth: Payer: Self-pay | Admitting: *Deleted

## 2016-11-26 ENCOUNTER — Ambulatory Visit (INDEPENDENT_AMBULATORY_CARE_PROVIDER_SITE_OTHER): Payer: BLUE CROSS/BLUE SHIELD | Admitting: Obstetrics & Gynecology

## 2016-11-26 ENCOUNTER — Telehealth: Payer: Self-pay | Admitting: Obstetrics & Gynecology

## 2016-11-26 VITALS — BP 110/80 | HR 72 | Ht 64.0 in | Wt 107.0 lb

## 2016-11-26 DIAGNOSIS — N760 Acute vaginitis: Secondary | ICD-10-CM | POA: Diagnosis not present

## 2016-11-26 DIAGNOSIS — N898 Other specified noninflammatory disorders of vagina: Secondary | ICD-10-CM

## 2016-11-26 DIAGNOSIS — B9689 Other specified bacterial agents as the cause of diseases classified elsewhere: Secondary | ICD-10-CM

## 2016-11-26 MED ORDER — METRONIDAZOLE 0.75 % VA GEL: VAGINAL | 0 refills | Status: DC

## 2016-11-26 NOTE — Telephone Encounter (Signed)
Pt states will need to speak to the her pharmacy for the Lupron injection [phone])  Called patients pharmacy and Lupron to be delivered 11/27/2016 to our office. Pt informed.

## 2016-11-26 NOTE — Telephone Encounter (Signed)
Pt informed we have not received her Lupron injectio. Pt states she is going to f/u with pharmacy.

## 2016-11-26 NOTE — Progress Notes (Signed)
       Chief Complaint  Patient presents with  . Follow-up    want come off Lupron- weight loss/ c/o vaginal discharge- yellow in color    Blood pressure 110/80, pulse 72, height 5\' 4"  (1.626 m), weight 107 lb (48.5 kg), last menstrual period 07/03/2016.  35 y.o. No obstetric history on file. Patient's last menstrual period was 07/03/2016. The current method of family planning is status post hysterectomy.  Subjective Vaginal discharge for 1weeks Itching no Irritation yes Odor yes Similar to previous yes  Previous treatment yes  Objective Vulva:  normal appearing vulva with no masses, tenderness or lesions Vagina:  normal mucosa, thin grey discharge Cervix:  absent Uterus:  uterus absent Adnexa: ovaries:,  normal adnexa in size, nontender and no masses     Pertinent ROS   Labs or studies Wet Prep:   A sample of vaginal discharge was obtained from the posterior fornix using a cotton swab. 2 drops of saline were placed on a slide and the cotton swab was immersed in the saline. Microscopic evaluation was performed and results were as follows:  Negative  for yeast positive for clue cells , consistent with Bacterial vaginosis Negative for trichomonas  Normal WBC population   Whiff test: Positive     Impression Diagnoses this Encounter::   ICD-9-CM ICD-10-CM   1. BV (bacterial vaginosis) 616.10 N76.0    041.9 B96.89     Established relevant diagnosis(es): On Lupron for severe endometriosis  Plan/Recommendations: Meds ordered this encounter  Medications  . zolpidem (AMBIEN) 10 MG tablet    Sig: Take 10 mg by mouth at bedtime as needed for sleep.  . metroNIDAZOLE (METROGEL VAGINAL) 0.75 % vaginal gel    Sig: Nightly x 5 nights    Dispense:  70 g    Refill:  0    Labs or Scans Ordered: No orders of the defined types were placed in this encounter.   Management:: metrogel vaginal cream qohs x 5  Follow up Return if symptoms worsen or fail to  improve.        All questions were answered.

## 2016-11-27 ENCOUNTER — Telehealth: Payer: Self-pay | Admitting: Obstetrics and Gynecology

## 2016-11-27 NOTE — Telephone Encounter (Signed)
Pt informed to bring Lupron to her appt on 12/01/2016 so she can get the injection. Also pt to follow instruction on the Lupron on how to store until her appt. Pt verbalized understanding.

## 2016-12-01 ENCOUNTER — Encounter: Payer: Self-pay | Admitting: *Deleted

## 2016-12-01 ENCOUNTER — Ambulatory Visit (INDEPENDENT_AMBULATORY_CARE_PROVIDER_SITE_OTHER): Payer: BLUE CROSS/BLUE SHIELD | Admitting: *Deleted

## 2016-12-01 DIAGNOSIS — N809 Endometriosis, unspecified: Secondary | ICD-10-CM

## 2016-12-01 DIAGNOSIS — Z3202 Encounter for pregnancy test, result negative: Secondary | ICD-10-CM | POA: Diagnosis not present

## 2016-12-01 LAB — POCT URINE PREGNANCY: Preg Test, Ur: NEGATIVE

## 2016-12-01 MED ORDER — LEUPROLIDE ACETATE (3 MONTH) 11.25 MG IM KIT
11.2500 mg | PACK | Freq: Once | INTRAMUSCULAR | Status: AC
  Administered 2016-12-01: 11.25 mg via INTRAMUSCULAR

## 2016-12-01 NOTE — Progress Notes (Signed)
Pt here for Lupron 11.25 mg. Pt tolerated shot well. Return in 12 weeks to see Dr. Elonda Husky, sooner if need be. Pt voiced understanding. [place]

## 2017-02-10 ENCOUNTER — Other Ambulatory Visit: Payer: Self-pay | Admitting: Obstetrics & Gynecology

## 2017-02-23 ENCOUNTER — Other Ambulatory Visit: Payer: Self-pay | Admitting: Obstetrics & Gynecology

## 2017-02-23 ENCOUNTER — Ambulatory Visit: Payer: BLUE CROSS/BLUE SHIELD

## 2017-02-23 ENCOUNTER — Telehealth: Payer: Self-pay | Admitting: *Deleted

## 2017-02-23 MED ORDER — METRONIDAZOLE 0.75 % VA GEL: VAGINAL | 0 refills | Status: DC

## 2017-02-23 MED ORDER — MEGESTROL ACETATE 40 MG PO TABS: 40.0000 mg | ORAL_TABLET | Freq: Every day | ORAL | 1 refills | Status: DC

## 2017-02-23 NOTE — Telephone Encounter (Signed)
Patient called stating she is taking Lupron and thinks she has a yeast infection (itching, yellow discharge at times). She has used the Metrogel before.   She also states she is still continuing to lose weight.   Please advise.

## 2017-02-24 NOTE — Telephone Encounter (Signed)
LMOVM that prescription was sent to pharmacy.  

## 2017-10-05 ENCOUNTER — Ambulatory Visit (INDEPENDENT_AMBULATORY_CARE_PROVIDER_SITE_OTHER): Payer: BLUE CROSS/BLUE SHIELD | Admitting: Obstetrics & Gynecology

## 2017-10-05 ENCOUNTER — Encounter: Payer: Self-pay | Admitting: Obstetrics & Gynecology

## 2017-10-05 ENCOUNTER — Encounter (INDEPENDENT_AMBULATORY_CARE_PROVIDER_SITE_OTHER): Payer: Self-pay

## 2017-10-05 ENCOUNTER — Telehealth: Payer: Self-pay | Admitting: Obstetrics & Gynecology

## 2017-10-05 VITALS — BP 110/70 | HR 102 | Ht 64.0 in | Wt 111.0 lb

## 2017-10-05 DIAGNOSIS — N809 Endometriosis, unspecified: Secondary | ICD-10-CM

## 2017-10-05 DIAGNOSIS — R1031 Right lower quadrant pain: Secondary | ICD-10-CM

## 2017-10-05 MED ORDER — ZOLPIDEM TARTRATE 10 MG PO TABS: 10.0000 mg | ORAL_TABLET | Freq: Every evening | ORAL | 5 refills | Status: DC | PRN

## 2017-10-05 MED ORDER — HYDROCODONE-ACETAMINOPHEN 5-325 MG PO TABS: 1.0000 | ORAL_TABLET | Freq: Four times a day (QID) | ORAL | 0 refills | Status: DC | PRN

## 2017-10-05 NOTE — Telephone Encounter (Signed)
States that she is having pain of her ovary and she is having a lot of anxiety and she does not know what to do. Please contact pt

## 2017-10-05 NOTE — Telephone Encounter (Signed)
Patient called stating she has been having pain in her ovary for the last month. She has also been having increased anxiety and wants to be seen. Will workin with Dr Elonda Husky this afternoon.

## 2017-10-05 NOTE — Progress Notes (Signed)
Chief Complaint  Patient presents with  . Ovarian Pain    right side     HPI:    Desiree Pope Patient's last menstrual period was 07/03/2016.  Of particular note patient is status post laparoscopically assisted vaginal hysterectomy with left salpingo-oophorectomy and right salpingectomy on 07/22/2016 and then she received 6 months of postoperative Lupron Her symptoms have been present for the past 3 months or so Location:  Right lower quadrant. Quality:  Stabbing. Severity:  Moderate to severe. Timing:  Episodic and with intercourse. Duration:  Minutes to hours. Context:  No particular activity or movement. Modifying factors:  Pain with intercourse Signs/Symptoms:      Current Outpatient Prescriptions:  .  diphenhydrAMINE (BENADRYL) 25 MG tablet, Take 25 mg by mouth every 6 (six) hours as needed., Disp: , Rfl:  .  loratadine (CLARITIN) 10 MG tablet, Take 10 mg by mouth daily as needed for allergies., Disp: , Rfl:  .  butalbital-acetaminophen-caffeine (FIORICET, ESGIC) 50-325-40 MG tablet, Take 1-2 tablets by mouth every 6 (six) hours as needed for headache. (Patient not taking: Reported on 10/05/2017), Disp: 20 tablet, Rfl: 1 .  cyclobenzaprine (FLEXERIL) 10 MG tablet, TK 1 T PRN TID IF AT HOME AND CAN BE DROWSEY- HS ONLY IF WORKING -NO DRIVING, Disp: , Rfl: 0 .  estradiol (ESTRACE) 2 MG tablet, Take 1 tablet (2 mg total) by mouth daily. (Patient not taking: Reported on 10/05/2017), Disp: 30 tablet, Rfl: 6 .  HYDROcodone-acetaminophen (NORCO/VICODIN) 5-325 MG tablet, Take 1 tablet by mouth every 6 (six) hours as needed., Disp: 30 tablet, Rfl: 0 .  megestrol (MEGACE) 40 MG tablet, Take 1 tablet (40 mg total) by mouth daily. (Patient not taking: Reported on 10/05/2017), Disp: 30 tablet, Rfl: 1 .  metroNIDAZOLE (METROGEL VAGINAL) 0.75 % vaginal gel, Nightly x 5 nights (Patient not taking: Reported on 10/05/2017), Disp: 70 g, Rfl: 0 .  zolpidem (AMBIEN) 10 MG tablet, Take 1 tablet (10  mg total) by mouth at bedtime as needed for sleep., Disp: 30 tablet, Rfl: 3 .  zolpidem (AMBIEN) 10 MG tablet, Take 10 mg by mouth at bedtime as needed for sleep., Disp: , Rfl:  .  zolpidem (AMBIEN) 10 MG tablet, Take 1 tablet (10 mg total) by mouth at bedtime as needed for sleep., Disp: 30 tablet, Rfl: 5  Problem Pertinent ROS:       No burning with urination, frequency or urgency No nausea, vomiting or diarrhea Nor fever chills or other constitutional symptoms   Extended ROS:     N/A   PMFSH:             Past Medical History:  Diagnosis Date  . Endometriosis   . Hx of chlamydia infection   . Pericarditis    age 45    Past Surgical History:  Procedure Laterality Date  . CARDIAC CATHETERIZATION    . LAPAROSCOPIC UNILATERAL SALPINGECTOMY Right 07/22/2016   Procedure: LAPAROSCOPIC RIGHT SALPINGECTOMY;  Surgeon: Florian Buff, MD;  Location: [facility];  Service: Gynecology;  Laterality: Right;  . LAPAROSCOPIC VAGINAL HYSTERECTOMY WITH SALPINGO OOPHORECTOMY Left 07/22/2016   Procedure: LAPAROSCOPIC ASSISTED VAGINAL HYSTERECTOMY LEFT WITH SALPINGO OOPHORECTOMY;  Surgeon: Florian Buff, MD;  Location: [facility];  Service: Gynecology;  Laterality: Left;  . TUBAL LIGATION      OB History    Gravida Para Term Preterm AB Living   2 2 2     2    SAB TAB Ectopic Multiple Live Births  2      Allergies  Allergen Reactions  . Iodine Anaphylaxis  . Motrin [Ibuprofen]     Social History   Social History  . Marital status: Married    Spouse name: N/A  . Number of children: N/A  . Years of education: N/A   Social History Main Topics  . Smoking status: Current Every Day Smoker    Packs/day: 0.25    Years: 18.00    Types: Cigarettes  . Smokeless tobacco: Never Used  . Alcohol use No  . Drug use: No  . Sexual activity: Yes    Birth control/ protection: Surgical     Comment: hyst   Other Topics Concern  . None   Social History Narrative  . None    Family History   Problem Relation Age of Onset  . Hypertension Other   . Cancer Other   . Diabetes Other   . Thyroid disease Other   . Hypertension Father   . Sleep apnea Father   . Hypertension Mother   . Sleep apnea Mother   . Cancer Mother 104       stomach and breast   . Hyperlipidemia Mother   . Diabetes Mother   . Asthma Sister   . Hypertension Sister   . Asthma Son   . Cancer Paternal Grandmother        bone,stomach  . Hyperlipidemia Maternal Grandmother   . Hypertension Maternal Grandmother   . Thyroid disease Maternal Grandmother   . Diabetes Maternal Grandfather      Examination:  Vitals:  Blood pressure 110/70, pulse (!) 102, height 5\' 4"  (1.626 m), weight 111 lb (50.3 kg), last menstrual period 07/03/2016.    Physical Examination:     General Appearance:  awake, alert, oriented, in no acute distress Abdomen is soft scaphoid well-healed incisions no hepatosplenomegaly tenderness in the right lower quadrant no rebound Vulva:  normal appearing vulva with no masses, tenderness or lesions Vagina:  normal mucosa, no discharge Cervix:  absent Uterus:  uterus absent Adnexa: ovaries:,  Left adnexa is benign, right adnexa is tender but the ovary is palpated and feels to be normal although quite tender  No results found for this or any previous visit (from the past 72 hour(s)).   DATA orders and reviews: Labs were not ordered today:   Imaging studies were ordered today:  Pelvic sonogram  Lab tests were not reviewed today:    Imaging studies were reviewed today:  Previous sonogram  I did independently review/view images, tracing or specimen(not simply the report) myself.    Prescription Drug Management:   Prescriptions prescribed this encounter:    Meds ordered this encounter  Medications  . cyclobenzaprine (FLEXERIL) 10 MG tablet    Sig: TK 1 T PRN TID IF AT HOME AND CAN BE DROWSEY- HS ONLY IF WORKING -NO DRIVING    Refill:  0  . zolpidem (AMBIEN) 10 MG tablet    Sig:  Take 1 tablet (10 mg total) by mouth at bedtime as needed for sleep.    Dispense:  30 tablet    Refill:  5  . HYDROcodone-acetaminophen (NORCO/VICODIN) 5-325 MG tablet    Sig: Take 1 tablet by mouth every 6 (six) hours as needed.    Dispense:  30 tablet    Refill:  0    Renewed Prescriptions:  none  Current prescription changes:  none   Impression/Plan(Problem Based):  1. RLQ abdominal pain  - US PELVIS TRANSVANGINAL NON-OB (TV  ONLY); Future - US PELVIS (TRANSABDOMINAL ONLY); Future  2. Endometriosis  - US PELVIS TRANSVANGINAL NON-OB (TV ONLY); Future - US PELVIS (TRANSABDOMINAL ONLY); Future  Give the patient some Lortab for her pain and Ambien to help her sleep and will follow her up in a couple weeks for the ultrasounds Of course with her history of endometriosis there is concern that now her right ovary is becoming more problematic She certainly understood that this was a possibility we did her surgery but because of all menopause we avoided removing the right ovary because it was basically normal at the time of surgery and her symptoms have only returned in the last few months   Follow Up:   Return in about 2 weeks (around 10/19/2017) for GYN sono, Follow up, with Dr Elonda Husky.       All questions were answered.

## 2017-10-19 ENCOUNTER — Other Ambulatory Visit: Payer: BLUE CROSS/BLUE SHIELD

## 2017-10-19 ENCOUNTER — Ambulatory Visit: Payer: BLUE CROSS/BLUE SHIELD | Admitting: Obstetrics & Gynecology

## 2017-10-31 ENCOUNTER — Encounter: Payer: Self-pay | Admitting: Obstetrics & Gynecology

## 2017-11-15 ENCOUNTER — Other Ambulatory Visit: Payer: BLUE CROSS/BLUE SHIELD

## 2017-11-15 ENCOUNTER — Ambulatory Visit: Payer: BLUE CROSS/BLUE SHIELD | Admitting: Obstetrics & Gynecology

## 2017-12-10 ENCOUNTER — Ambulatory Visit (INDEPENDENT_AMBULATORY_CARE_PROVIDER_SITE_OTHER): Payer: BLUE CROSS/BLUE SHIELD | Admitting: Obstetrics & Gynecology

## 2017-12-10 ENCOUNTER — Encounter: Payer: Self-pay | Admitting: Obstetrics & Gynecology

## 2017-12-10 ENCOUNTER — Ambulatory Visit (INDEPENDENT_AMBULATORY_CARE_PROVIDER_SITE_OTHER): Payer: BLUE CROSS/BLUE SHIELD

## 2017-12-10 VITALS — BP 100/70 | Ht 64.0 in | Wt 111.0 lb

## 2017-12-10 DIAGNOSIS — R1031 Right lower quadrant pain: Secondary | ICD-10-CM

## 2017-12-10 DIAGNOSIS — N809 Endometriosis, unspecified: Secondary | ICD-10-CM

## 2017-12-10 MED ORDER — NORETHINDRONE-ETH ESTRADIOL 0.4-35 MG-MCG PO TABS: 1.0000 | ORAL_TABLET | Freq: Every day | ORAL | 12 refills | Status: DC

## 2017-12-10 MED ORDER — HYDROCODONE-ACETAMINOPHEN 5-325 MG PO TABS: 1.0000 | ORAL_TABLET | Freq: Four times a day (QID) | ORAL | 0 refills | Status: DC | PRN

## 2017-12-10 NOTE — Progress Notes (Signed)
Follow up appointment for results  Chief Complaint  Patient presents with  . Follow-up    ultrasound today    Blood pressure 100/70, height 5\' 4"  (1.626 m), weight 111 lb (50.3 kg), last menstrual period 07/03/2016.  US Pelvis Transvanginal Non-ob (tv Only)  Result Date: 12/10/2017 GYNECOLOGIC SONOGRAM Desiree TRZCINSKI is a 36 y.o. [id number] s/p hysterectomy and left oophorectomy,she is here for a pelvic sonogram for RLQ pain with a HX of endometriosis. Vaginal cuff            WNL Endometrium         N/A Right ovary             2.9 x 2 x 2.8 cm, WNL Left ovary               Surgically removed,left adnexa WNL No free fluid Technician Comments: PELVIC US TA/TV: normal vaginal cuff,normal right ovary,right ovary appears mobile,left oophorectomy,left adnexa wnl,right adnexal pain during ultrasound,no free fluid [employer] 12/10/2017 10:43 AM Clinical Impression and recommendations: I have reviewed the sonogram results above, combined with the patient's current clinical course, below are my impressions and any appropriate recommendations for management based on the sonographic findings. Uterus and left ovary and tube are surgically absnet Right ovary is normal but has pain with examination Florian Buff 12/10/2017 10:59 AM   US Pelvis (transabdominal Only)  Result Date: 12/10/2017 GYNECOLOGIC SONOGRAM Desiree Pope is a 36 y.o. [id number] s/p hysterectomy and left oophorectomy,she is here for a pelvic sonogram for RLQ pain with a HX of endometriosis. Vaginal cuff            WNL Endometrium         N/A Right ovary             2.9 x 2 x 2.8 cm, WNL Left ovary               Surgically removed,left adnexa WNL No free fluid Technician Comments: PELVIC US TA/TV: normal vaginal cuff,normal right ovary,right ovary appears mobile,left oophorectomy,left adnexa wnl,right adnexal pain during ultrasound,no free fluid [employer] 12/10/2017 10:43 AM Clinical Impression and recommendations: I have reviewed the sonogram  results above, combined with the patient's current clinical course, below are my impressions and any appropriate recommendations for management based on the sonographic findings. Uterus and left ovary and tube are surgically absnet Right ovary is normal but has pain with examination Florian Buff 12/10/2017 10:59 AM   Pt informed of findings of the sonogram Pt of course continues to have RLQ burning type pain and dyspareunia Will try a course of OCP continuously for both the hormone replacemtn side as well as possible suppression  MEDS ordered this encounter: Meds ordered this encounter  Medications  . norethindrone-ethinyl estradiol (OVCON-35,BALZIVA,BRIELLYN) 0.4-35 MG-MCG tablet    Sig: Take 1 tablet by mouth daily. Take continuously, skip the placebo pills in each pack    Dispense:  1 Package    Refill:  12  . HYDROcodone-acetaminophen (NORCO/VICODIN) 5-325 MG tablet    Sig: Take 1 tablet by mouth every 6 (six) hours as needed.    Dispense:  30 tablet    Refill:  0    Orders for this encounter: No orders of the defined types were placed in this encounter.   Impression: RLQ pain Endometriosis s/p TAH LSO  Plan: Follow up 4 months  Follow Up: Return in about 4 months (around 04/10/2018) for Follow up, with Dr Elonda Husky.  Face to face time:  15 minutes  Greater than 50% of the visit time was spent in counseling and coordination of care with the patient.  The summary and outline of the counseling and care coordination is summarized in the note above.   All questions were answered.  Past Medical History:  Diagnosis Date  . Endometriosis   . Hx of chlamydia infection   . Pericarditis    age 28    Past Surgical History:  Procedure Laterality Date  . CARDIAC CATHETERIZATION    . LAPAROSCOPIC UNILATERAL SALPINGECTOMY Right 07/22/2016   Procedure: LAPAROSCOPIC RIGHT SALPINGECTOMY;  Surgeon: Florian Buff, MD;  Location: [facility];  Service: Gynecology;  Laterality: Right;   . LAPAROSCOPIC VAGINAL HYSTERECTOMY WITH SALPINGO OOPHORECTOMY Left 07/22/2016   Procedure: LAPAROSCOPIC ASSISTED VAGINAL HYSTERECTOMY LEFT WITH SALPINGO OOPHORECTOMY;  Surgeon: Florian Buff, MD;  Location: [facility];  Service: Gynecology;  Laterality: Left;  . TUBAL LIGATION      OB History    Gravida Para Term Preterm AB Living   2 2 2     2    SAB TAB Ectopic Multiple Live Births           2      Allergies  Allergen Reactions  . Iodine Anaphylaxis  . Motrin [Ibuprofen]     Social History   Socioeconomic History  . Marital status: Married    Spouse name: None  . Number of children: None  . Years of education: None  . Highest education level: None  Social Needs  . Financial resource strain: None  . Food insecurity - worry: None  . Food insecurity - inability: None  . Transportation needs - medical: None  . Transportation needs - non-medical: None  Occupational History  . None  Tobacco Use  . Smoking status: Current Every Day Smoker    Packs/day: 0.25    Years: 18.00    Pack years: 4.50    Types: Cigarettes  . Smokeless tobacco: Never Used  Substance and Sexual Activity  . Alcohol use: No  . Drug use: No  . Sexual activity: Yes    Birth control/protection: Surgical    Comment: hyst  Other Topics Concern  . None  Social History Narrative  . None    Family History  Problem Relation Age of Onset  . Hypertension Other   . Cancer Other   . Diabetes Other   . Thyroid disease Other   . Hypertension Father   . Sleep apnea Father   . Hypertension Mother   . Sleep apnea Mother   . Cancer Mother 70       stomach and breast   . Hyperlipidemia Mother   . Diabetes Mother   . Asthma Sister   . Hypertension Sister   . Asthma Son   . Cancer Paternal Grandmother        bone,stomach  . Hyperlipidemia Maternal Grandmother   . Hypertension Maternal Grandmother   . Thyroid disease Maternal Grandmother   . Diabetes Maternal Grandfather

## 2017-12-10 NOTE — Progress Notes (Signed)
PELVIC US TA/TV: normal vaginal cuff,normal right ovary,right ovary appears mobile,left oophorectomy,left adnexa wnl,right adnexal pain during ultrasound,no free fluid

## 2018-04-11 ENCOUNTER — Ambulatory Visit: Payer: BLUE CROSS/BLUE SHIELD | Admitting: Obstetrics & Gynecology

## 2019-07-10 ENCOUNTER — Emergency Department (HOSPITAL_COMMUNITY)
Admission: EM | Admit: 2019-07-10 | Discharge: 2019-07-10 | Disposition: A | Discharge status: Home / Self Care | Payer: Medicaid Other | Attending: Emergency Medicine | Admitting: Emergency Medicine

## 2019-07-10 ENCOUNTER — Emergency Department (HOSPITAL_COMMUNITY): Payer: Medicaid Other

## 2019-07-10 ENCOUNTER — Other Ambulatory Visit: Payer: Self-pay

## 2019-07-10 ENCOUNTER — Encounter (HOSPITAL_COMMUNITY): Payer: Self-pay | Admitting: Emergency Medicine

## 2019-07-10 DIAGNOSIS — R0789 Other chest pain: Secondary | ICD-10-CM | POA: Diagnosis present

## 2019-07-10 DIAGNOSIS — Z87891 Personal history of nicotine dependence: Secondary | ICD-10-CM | POA: Insufficient documentation

## 2019-07-10 DIAGNOSIS — Z20828 Contact with and (suspected) exposure to other viral communicable diseases: Secondary | ICD-10-CM | POA: Diagnosis not present

## 2019-07-10 DIAGNOSIS — R110 Nausea: Secondary | ICD-10-CM | POA: Diagnosis not present

## 2019-07-10 DIAGNOSIS — B349 Viral infection, unspecified: Secondary | ICD-10-CM | POA: Diagnosis not present

## 2019-07-10 HISTORY — DX: Anxiety disorder, unspecified: F41.9

## 2019-07-10 LAB — COMPREHENSIVE METABOLIC PANEL
ALT: 19 U/L (ref 0–44)
AST: 20 U/L (ref 15–41)
Albumin: 4.2 g/dL (ref 3.5–5.0)
Alkaline Phosphatase: 56 U/L (ref 38–126)
Anion gap: 11 (ref 5–15)
BUN: 12 mg/dL (ref 6–20)
CO2: 24 mmol/L (ref 22–32)
Calcium: 9.2 mg/dL (ref 8.9–10.3)
Chloride: 103 mmol/L (ref 98–111)
Creatinine, Ser: 0.6 mg/dL (ref 0.44–1.00)
GFR calc Af Amer: >60 mL/min (ref >60)
GFR calc non Af Amer: >60 mL/min (ref >60)
Glucose, Bld: 93 mg/dL (ref 70–99)
Potassium: 3.8 mmol/L (ref 3.5–5.1)
Sodium: 138 mmol/L (ref 135–145)
Total Bilirubin: 0.5 mg/dL (ref 0.3–1.2)
Total Protein: 7.3 g/dL (ref 6.5–8.1)

## 2019-07-10 LAB — URINALYSIS, ROUTINE W REFLEX MICROSCOPIC
Bacteria, UA: NONE SEEN
Bilirubin Urine: NEGATIVE
Glucose, UA: NEGATIVE mg/dL
Ketones, ur: NEGATIVE mg/dL
Leukocytes,Ua: NEGATIVE
Nitrite: NEGATIVE
Protein, ur: NEGATIVE mg/dL
Specific Gravity, Urine: 1.008 (ref 1.005–1.030)
pH: 7 (ref 5.0–8.0)

## 2019-07-10 LAB — CBC WITH DIFFERENTIAL/PLATELET
Abs Immature Granulocytes: 0.01 10*3/uL (ref 0.00–0.07)
Basophils Absolute: 0 10*3/uL (ref 0.0–0.1)
Basophils Relative: 0 %
Eosinophils Absolute: 0.1 10*3/uL (ref 0.0–0.5)
Eosinophils Relative: 1 %
HCT: 41.5 % (ref 36.0–46.0)
Hemoglobin: 13.5 g/dL (ref 12.0–15.0)
Immature Granulocytes: 0 %
Lymphocytes Relative: 23 %
Lymphs Abs: 1.6 10*3/uL (ref 0.7–4.0)
MCH: 29.7 pg (ref 26.0–34.0)
MCHC: 32.5 g/dL (ref 30.0–36.0)
MCV: 91.2 fL (ref 80.0–100.0)
Monocytes Absolute: 0.6 10*3/uL (ref 0.1–1.0)
Monocytes Relative: 9 %
Neutro Abs: 4.6 10*3/uL (ref 1.7–7.7)
Neutrophils Relative %: 67 %
Platelets: 284 10*3/uL (ref 150–400)
RBC: 4.55 MIL/uL (ref 3.87–5.11)
RDW: 12.1 % (ref 11.5–15.5)
WBC: 6.9 10*3/uL (ref 4.0–10.5)
nRBC: 0 % (ref 0.0–0.2)

## 2019-07-10 LAB — TROPONIN I (HIGH SENSITIVITY)
Troponin I (High Sensitivity): <2 ng/L (ref <18)
Troponin I (High Sensitivity): <2 ng/L (ref <18)

## 2019-07-10 LAB — D-DIMER, QUANTITATIVE: D-Dimer, Quant: 0.3 ug/mL-FEU (ref 0.00–0.50)

## 2019-07-10 MED ORDER — ACETAMINOPHEN 325 MG PO TABS
650.0000 mg | ORAL_TABLET | Freq: Once | ORAL | Status: AC
  Administered 2019-07-10: 13:00:00 650 mg via ORAL
  Filled 2019-07-10: qty 2

## 2019-07-10 MED ORDER — PROMETHAZINE HCL 25 MG PO TABS: 25.0000 mg | ORAL_TABLET | Freq: Four times a day (QID) | ORAL | 0 refills | Status: DC | PRN

## 2019-07-10 MED ORDER — KETOROLAC TROMETHAMINE 30 MG/ML IJ SOLN
15.0000 mg | Freq: Once | INTRAMUSCULAR | Status: AC
  Administered 2019-07-10: 17:00:00 15 mg via INTRAVENOUS
  Filled 2019-07-10: qty 1

## 2019-07-10 MED ORDER — PROMETHAZINE HCL 12.5 MG PO TABS
25.0000 mg | ORAL_TABLET | Freq: Once | ORAL | Status: AC
  Administered 2019-07-10: 25 mg via ORAL
  Filled 2019-07-10: qty 2

## 2019-07-10 MED ORDER — ONDANSETRON HCL 4 MG/2ML IJ SOLN
4.0000 mg | Freq: Once | INTRAMUSCULAR | Status: AC
  Administered 2019-07-10: 4 mg via INTRAVENOUS
  Filled 2019-07-10: qty 2

## 2019-07-10 NOTE — ED Provider Notes (Signed)
[facility] Provider Note   CSN: [id number] Arrival date & time: 07/10/19  1021    History   Chief Complaint Chief Complaint  Patient presents with  . Chest Pain    HPI Desiree Pope is a 38 y.o. female with a history of anxiety, endometriosis status post hysterectomy and distant history of pericarditis presenting with a 24-hour history of generalized myalgias in association with nausea, upper abdominal pain along with cough which has been nonproductive, right-sided chest pain which is worsened with coughing, shortness of breath with exertion, right-sided headache along with right ear pain and pressure.  She denies decreased hearing acuity, denies dizziness.  No rash.  No neck stiffness.  She also endorses nasal congestion with clear rhinorrhea which she has been treating with Flonase.  She has had no documented fevers but has felt febrile and has had reduced appetite since yesterday.  She had 2 small stools this a.m. which were softer than normal but not diarrheal.  She is found no alleviators for symptoms.  Patient works as a Radio producer, no obvious known exposures to [employer].  HPI: A 38 year old patient presents for evaluation of chest pain. Initial onset of pain was more than 6 hours ago. The patient's chest pain is not worse with exertion. The patient complains of nausea. The patient's chest pain is not middle- or left-sided, is not well-localized, is not described as heaviness/pressure/tightness, is not sharp and does not radiate to the arms/jaw/neck. The patient denies diaphoresis. The patient has no history of stroke, has no history of peripheral artery disease, has not smoked in the past 90 days, denies any history of treated diabetes, has no relevant family history of coronary artery disease (first degree relative at less than age 62), is not hypertensive, has no history of hypercholesterolemia and does not have an elevated BMI (>=30).   The  history is provided by the patient.    Past Medical History:  Diagnosis Date  . Anxiety   . Endometriosis   . Hx of chlamydia infection   . Pericarditis    age 38    Patient Active Problem List   Diagnosis Date Noted  . S/P laparoscopic assisted vaginal hysterectomy (LAVH) 07/22/2016  . Hx of chlamydia infection 04/12/2013    Past Surgical History:  Procedure Laterality Date  . CARDIAC CATHETERIZATION    . LAPAROSCOPIC UNILATERAL SALPINGECTOMY Right 07/22/2016   Procedure: LAPAROSCOPIC RIGHT SALPINGECTOMY;  Surgeon: Florian Buff, MD;  Location: [facility];  Service: Gynecology;  Laterality: Right;  . LAPAROSCOPIC VAGINAL HYSTERECTOMY WITH SALPINGO OOPHORECTOMY Left 07/22/2016   Procedure: LAPAROSCOPIC ASSISTED VAGINAL HYSTERECTOMY LEFT WITH SALPINGO OOPHORECTOMY;  Surgeon: Florian Buff, MD;  Location: [facility];  Service: Gynecology;  Laterality: Left;  . TUBAL LIGATION       OB History    Gravida  2   Para  2   Term  2   Preterm      AB      Living  2     SAB      TAB      Ectopic      Multiple      Live Births  2            Home Medications    Prior to Admission medications   Medication Sig Start Date End Date Taking? Authorizing Provider  cetirizine (ZYRTEC) 10 MG tablet Take 10 mg by mouth daily. 04/13/19  Yes [provider]  clonazePAM (KLONOPIN) 1 MG tablet Take 1 mg by mouth daily as needed for anxiety.  05/25/19  Yes [provider]  FLUoxetine (PROZAC) 40 MG capsule Take 40 mg by mouth daily.   Yes [provider]  fluticasone (FLONASE) 50 MCG/ACT nasal spray Place 2 sprays into both nostrils daily as needed for allergies or rhinitis.   Yes [provider]  Melatonin 3 MG TABS Take 3 mg by mouth at bedtime as needed (for sleep).  02/09/19  Yes [provider]  promethazine (PHENERGAN) 25 MG tablet Take 1 tablet (25 mg total) by mouth every 6 (six) hours as needed for nausea or vomiting. 07/10/19   Evalee Jefferson, PA-C    Family History Family History  Problem Relation Age of Onset  . Hypertension Other   . Cancer Other   . Diabetes Other   . Thyroid disease Other   . Hypertension Father   . Sleep apnea Father   . Hypertension Mother   . Sleep apnea Mother   . Cancer Mother 4       stomach and breast   . Hyperlipidemia Mother   . Diabetes Mother   . Asthma Sister   . Hypertension Sister   . Asthma Son   . Cancer Paternal Grandmother        bone,stomach  . Hyperlipidemia Maternal Grandmother   . Hypertension Maternal Grandmother   . Thyroid disease Maternal Grandmother   . Diabetes Maternal Grandfather     Social History Social History   Tobacco Use  . Smoking status: Former Smoker    Packs/day: 0.25    Years: 18.00    Pack years: 4.50    Types: Cigarettes    Quit date: 01/10/2019    Years since quitting: 0.4  . Smokeless tobacco: Never Used  Substance Use Topics  . Alcohol use: No  . Drug use: No     Allergies   Iodine, Shellfish allergy, and Motrin [ibuprofen]   Review of Systems Review of Systems  Constitutional: Positive for fever.  HENT: Positive for congestion and ear pain. Negative for sore throat.   Eyes: Negative.   Respiratory: Positive for cough and shortness of breath. Negative for chest tightness.   Cardiovascular: Positive for chest pain.  Gastrointestinal: Positive for abdominal pain and nausea. Negative for vomiting.  Genitourinary: Negative.  Negative for decreased urine volume and dysuria.  Musculoskeletal: Negative for arthralgias, joint swelling and neck pain.  Skin: Negative.  Negative for rash and wound.  Neurological: Negative for dizziness, weakness, light-headedness, numbness and headaches.  Psychiatric/Behavioral: Negative.      Physical Exam Updated Vital Signs BP 115/79   Pulse 73   Temp 98.1 F (36.7 C) (Oral)   Resp 15   Ht 5\' 4"  (1.626 m)   Wt 65.8 kg   LMP 07/03/2016 Comment: 07/22/16  SpO2 99%   BMI 24.89 kg/m    Physical Exam Vitals signs and nursing note reviewed.  Constitutional:      Appearance: She is well-developed.  HENT:     Head: Normocephalic and atraumatic.  Eyes:     Conjunctiva/sclera: Conjunctivae normal.  Neck:     Musculoskeletal: Normal range of motion and neck supple.  Cardiovascular:     Rate and Rhythm: Normal rate and regular rhythm.     Heart sounds: Normal heart sounds. No friction rub.  Pulmonary:     Effort: Pulmonary effort is normal. No accessory muscle usage or respiratory distress.     Breath  sounds: Normal breath sounds. No decreased breath sounds, wheezing or rhonchi.  Abdominal:     General: Bowel sounds are normal.     Palpations: Abdomen is soft.     Tenderness: There is no abdominal tenderness.  Musculoskeletal: Normal range of motion.  Skin:    General: Skin is warm and dry.     Findings: No rash.  Neurological:     Mental Status: She is alert.      ED Treatments / Results  Labs (all labs ordered are listed, but only abnormal results are displayed) Labs Reviewed  URINALYSIS, ROUTINE W REFLEX MICROSCOPIC - Abnormal; Notable for the following components:      Result Value   Color, Urine STRAW (*)    Hgb urine dipstick SMALL (*)    All other components within normal limits  NOVEL CORONAVIRUS, NAA (HOSPITAL ORDER, SEND-OUT TO REF LAB)  CBC WITH DIFFERENTIAL/PLATELET  COMPREHENSIVE METABOLIC PANEL  D-DIMER, QUANTITATIVE (NOT AT [facility])  TROPONIN I (HIGH SENSITIVITY)  TROPONIN I (HIGH SENSITIVITY)    EKG EKG Interpretation  Date/Time:  Monday July 10 2019 10:42:27 EDT Ventricular Rate:  70 PR Interval:    QRS Duration: 94 QT Interval:  390 QTC Calculation: 421 R Axis:   79 Text Interpretation:  Sinus rhythm RSR' in V1 or V2, right VCD or RVH When compared with ECG of 06/13/2011 No significant change was found Confirmed by Francine Graven [phone]) on 07/10/2019 12:00:18 PM Also confirmed by Milton Ferguson [phone])  on 07/10/2019 3:24:16 PM    Radiology Dg Chest Portable 1 View  Result Date: 07/10/2019 CLINICAL DATA:  PT c/o upper abdominal pain and right sided chest pain that started yesterday with 3 episodes of loose bowel movements. PT states some SOB with exertion and fullness feeling to the right side of her head and ears. PT denies any fevers. Hx former smoker EXAM: PORTABLE CHEST 1 VIEW COMPARISON:  None. FINDINGS: Normal heart, mediastinum and hila. Clear lungs.  No pleural effusion or pneumothorax. Skeletal structures are unremarkable. IMPRESSION: No active disease. Electronically Signed   By: Lajean Manes M.D.   On: 07/10/2019 12:40    Procedures Procedures (including critical care time)  Medications Ordered in ED Medications  ondansetron (ZOFRAN) injection 4 mg (4 mg Intravenous Given 07/10/19 1239)  acetaminophen (TYLENOL) tablet 650 mg (650 mg Oral Given 07/10/19 1238)  promethazine (PHENERGAN) tablet 25 mg (25 mg Oral Given 07/10/19 1457)  ketorolac (TORADOL) 30 MG/ML injection 15 mg (15 mg Intravenous Given 07/10/19 1638)     Initial Impression / Assessment and Plan / ED Course  I have reviewed the triage vital signs and the nursing notes.  Pertinent labs & imaging results that were available during my care of the patient were reviewed by me and considered in my medical decision making (see chart for details).     HEAR Score: 0  Pt with multiple sx with normal findings on exam, labs, imaging normal.  She was given zofran and tylenol, still with nausea and headache.  She was then given phenergan and IV toradol with improvement in sx. Still with c/o right sided CP. She has normal VS, no hypoxia.  Sob by hx, none on exam. D dimer added to rule out PE. Covid send out screening also obtained.  D dimer negative.  Doubt PE, no acs.  Pt was given covid isolation instructions until covid result is back. Prior to dc she states coworker has had similar sx, but her covid test was negative.  Desiree Pope was evaluated in  Emergency Department on 07/10/2019 for the symptoms described in the history of present illness. She was evaluated in the context of the global COVID-19 pandemic, which necessitated consideration that the patient might be at risk for infection with the SARS-CoV-2 virus that causes COVID-19. Institutional protocols and algorithms that pertain to the evaluation of patients at risk for COVID-19 are in a state of rapid change based on information released by regulatory bodies including the CDC and federal and state organizations. These policies and algorithms were followed during the patient's care in the ED.   Final Clinical Impressions(s) / ED Diagnoses   Final diagnoses:  Viral syndrome    ED Discharge Orders         Ordered    promethazine (PHENERGAN) 25 MG tablet  Every 6 hours PRN     07/10/19 1730           Evalee Jefferson, PA-C 07/10/19 Jefferson Davis, [place], DO 07/14/19 1531

## 2019-07-10 NOTE — ED Triage Notes (Signed)
PT c/o upper abdominal pain and right sided chest pain that started yesterday with 3 episodes of loose bowel movements. PT states some SOB with exertion and fullness feeling to the right side of her head and ears. PT denies any fevers.

## 2019-07-10 NOTE — Discharge Instructions (Addendum)
Your lab tests, chest xray and ekg are stable today without abnormal findings and your history and exam suggests a viral syndrome.  You have been tested for possible Covid 19 infection (will take 1-2 days to result) as some of your symptoms suggest this possibility. Rest and make sure you are drinking plenty of fluids.  You will need to self quarantine until you have received negative Covid testing results.       Person Under Monitoring Name: Desiree Pope  Location: [place] [address]   Infection Prevention Recommendations for Individuals Confirmed to have, or Being Evaluated for, 2019 Novel Coronavirus (COVID-19) Infection Who Receive Care at Home  Individuals who are confirmed to have, or are being evaluated for, COVID-19 should follow the prevention steps below until a healthcare provider or local or state health department says they can return to normal activities.  Stay home except to get medical care You should restrict activities outside your home, except for getting medical care. Do not go to work, school, or public areas, and do not use public transportation or taxis.  Call ahead before visiting your doctor Before your medical appointment, call the healthcare provider and tell them that you have, or are being evaluated for, COVID-19 infection. This will help the healthcare providers office take steps to keep other people from getting infected. Ask your healthcare provider to call the local or state health department.  Monitor your symptoms Seek prompt medical attention if your illness is worsening (e.g., difficulty breathing). Before going to your medical appointment, call the healthcare provider and tell them that you have, or are being evaluated for, COVID-19 infection. Ask your healthcare provider to call the local or state health department.  Wear a facemask You should wear a facemask that covers your nose and mouth when you are in the same room  with other people and when you visit a healthcare provider. People who live with or visit you should also wear a facemask while they are in the same room with you.  Separate yourself from other people in your home As much as possible, you should stay in a different room from other people in your home. Also, you should use a separate bathroom, if available.  Avoid sharing household items You should not share dishes, drinking glasses, cups, eating utensils, towels, bedding, or other items with other people in your home. After using these items, you should wash them thoroughly with soap and water.  Cover your coughs and sneezes Cover your mouth and nose with a tissue when you cough or sneeze, or you can cough or sneeze into your sleeve. Throw used tissues in a lined trash can, and immediately wash your hands with soap and water for at least 20 seconds or use an alcohol-based hand rub.  Wash your [facility] your hands often and thoroughly with soap and water for at least 20 seconds. You can use an alcohol-based hand sanitizer if soap and water are not available and if your hands are not visibly dirty. Avoid touching your eyes, nose, and mouth with unwashed hands.   Prevention Steps for Caregivers and Household Members of Individuals Confirmed to have, or Being Evaluated for, COVID-19 Infection Being Cared for in the Home  If you live with, or provide care at home for, a person confirmed to have, or being evaluated for, COVID-19 infection please follow these guidelines to prevent infection:  Follow healthcare providers instructions Make sure that you understand and can help  the patient follow any healthcare provider instructions for all care.  Provide for the patients basic needs You should help the patient with basic needs in the home and provide support for getting groceries, prescriptions, and other personal needs.  Monitor the patients symptoms If they are getting sicker, call  his or her medical provider and tell them that the patient has, or is being evaluated for, COVID-19 infection. This will help the healthcare providers office take steps to keep other people from getting infected. Ask the healthcare provider to call the local or state health department.  Limit the number of people who have contact with the patient If possible, have only one caregiver for the patient. Other household members should stay in another home or place of residence. If this is not possible, they should stay in another room, or be separated from the patient as much as possible. Use a separate bathroom, if available. Restrict visitors who do not have an essential need to be in the home.  Keep older adults, very young children, and other sick people away from the patient Keep older adults, very young children, and those who have compromised immune systems or chronic health conditions away from the patient. This includes people with chronic heart, lung, or kidney conditions, diabetes, and cancer.  Ensure good ventilation Make sure that shared spaces in the home have good air flow, such as from an air conditioner or an opened window, weather permitting.  Wash your hands often Wash your hands often and thoroughly with soap and water for at least 20 seconds. You can use an alcohol based hand sanitizer if soap and water are not available and if your hands are not visibly dirty. Avoid touching your eyes, nose, and mouth with unwashed hands. Use disposable paper towels to dry your hands. If not available, use dedicated cloth towels and replace them when they become wet.  Wear a facemask and gloves Wear a disposable facemask at all times in the room and gloves when you touch or have contact with the patients blood, body fluids, and/or secretions or excretions, such as sweat, saliva, sputum, nasal mucus, vomit, urine, or feces.  Ensure the mask fits over your nose and mouth tightly, and do not  touch it during use. Throw out disposable facemasks and gloves after using them. Do not reuse. Wash your hands immediately after removing your facemask and gloves. If your personal clothing becomes contaminated, carefully remove clothing and launder. Wash your hands after handling contaminated clothing. Place all used disposable facemasks, gloves, and other waste in a lined container before disposing them with other household waste. Remove gloves and wash your hands immediately after handling these items.  Do not share dishes, glasses, or other household items with the patient Avoid sharing household items. You should not share dishes, drinking glasses, cups, eating utensils, towels, bedding, or other items with a patient who is confirmed to have, or being evaluated for, COVID-19 infection. After the person uses these items, you should wash them thoroughly with soap and water.  Wash laundry thoroughly Immediately remove and wash clothes or bedding that have blood, body fluids, and/or secretions or excretions, such as sweat, saliva, sputum, nasal mucus, vomit, urine, or feces, on them. Wear gloves when handling laundry from the patient. Read and follow directions on labels of laundry or clothing items and detergent. In general, wash and dry with the warmest temperatures recommended on the label.  Clean all areas the individual has used often Clean all touchable surfaces,  such as counters, tabletops, doorknobs, bathroom fixtures, toilets, phones, keyboards, tablets, and bedside tables, every day. Also, clean any surfaces that may have blood, body fluids, and/or secretions or excretions on them. Wear gloves when cleaning surfaces the patient has come in contact with. Use a diluted bleach solution (e.g., dilute bleach with 1 part bleach and 10 parts water) or a household disinfectant with a label that says EPA-registered for coronaviruses. To make a bleach solution at home, add 1 tablespoon of bleach  to 1 quart (4 cups) of water. For a larger supply, add  cup of bleach to 1 gallon (16 cups) of water. Read labels of cleaning products and follow recommendations provided on product labels. Labels contain instructions for safe and effective use of the cleaning product including precautions you should take when applying the product, such as wearing gloves or eye protection and making sure you have good ventilation during use of the product. Remove gloves and wash hands immediately after cleaning.  Monitor yourself for signs and symptoms of illness Caregivers and household members are considered close contacts, should monitor their health, and will be asked to limit movement outside of the home to the extent possible. Follow the monitoring steps for close contacts listed on the symptom monitoring form.   ? If you have additional questions, contact your local health department or call the epidemiologist on call at [phone] (available 24/7). ? This guidance is subject to change. For the most up-to-date guidance from [facility], please refer to their website: [email or web address]

## 2019-07-11 LAB — NOVEL CORONAVIRUS, NAA (HOSP ORDER, SEND-OUT TO REF LAB; TAT 18-24 HRS): SARS-CoV-2, NAA: NOT DETECTED

## 2020-01-18 ENCOUNTER — Other Ambulatory Visit: Payer: Self-pay

## 2020-01-18 ENCOUNTER — Encounter (HOSPITAL_COMMUNITY): Payer: Self-pay | Admitting: Emergency Medicine

## 2020-01-18 ENCOUNTER — Emergency Department (HOSPITAL_COMMUNITY)
Admission: EM | Admit: 2020-01-18 | Discharge: 2020-01-18 | Disposition: A | Discharge status: Home / Self Care | Payer: Medicaid Other | Attending: Emergency Medicine | Admitting: Emergency Medicine

## 2020-01-18 ENCOUNTER — Emergency Department (HOSPITAL_COMMUNITY): Payer: Medicaid Other

## 2020-01-18 DIAGNOSIS — Z87891 Personal history of nicotine dependence: Secondary | ICD-10-CM | POA: Insufficient documentation

## 2020-01-18 DIAGNOSIS — R202 Paresthesia of skin: Secondary | ICD-10-CM | POA: Diagnosis present

## 2020-01-18 DIAGNOSIS — M5416 Radiculopathy, lumbar region: Secondary | ICD-10-CM | POA: Diagnosis not present

## 2020-01-18 HISTORY — DX: Scoliosis, unspecified: M41.9

## 2020-01-18 LAB — URINALYSIS, ROUTINE W REFLEX MICROSCOPIC
Bilirubin Urine: NEGATIVE
Glucose, UA: NEGATIVE mg/dL
Ketones, ur: NEGATIVE mg/dL
Leukocytes,Ua: NEGATIVE
Nitrite: NEGATIVE
Protein, ur: NEGATIVE mg/dL
Specific Gravity, Urine: 1.013 (ref 1.005–1.030)
pH: 5 (ref 5.0–8.0)

## 2020-01-18 MED ORDER — HYDROCODONE-ACETAMINOPHEN 5-325 MG PO TABS: ORAL_TABLET | ORAL | 0 refills | Status: DC

## 2020-01-18 MED ORDER — PREDNISONE 10 MG PO TABS: ORAL_TABLET | ORAL | 0 refills | Status: DC

## 2020-01-18 NOTE — ED Triage Notes (Signed)
Patient with a history of spina bifida and scoliosis complains of bilateral leg and foot pain. Pain is also in her lower back. Patient has been unable to acquire a neurology referal appt,

## 2020-01-18 NOTE — ED Provider Notes (Signed)
[facility] EMERGENCY DEPARTMENT Provider Note   CSN: [id number] Arrival date & time: 01/18/20  1536     History Chief Complaint  Patient presents with  . Peripheral Neuropathy    ELLYANNA CARABAJAL is a 39 y.o. female.  HPI      KESSLEY RUGAR is a 39 y.o. female with past medical history of scoliosis who presents to the Emergency Department complaining of increasing pain, numbness and tingling to her bilateral lower legs and feet.  Symptoms have been progressing for weeks, but worse for several days.  She also complains of pain to her mid to lower back that is associated with certain movements.  She states pain is worse when she bends over or walks.  She describes a pins-and-needles sensation to both ankles that radiates anteriorly to her bilateral mid lower legs and some mild urinary frequency without burning or flank pain.  She states that her PCP has put her on gabapentin for the symptoms, but she denies any improvement and she has been taking the medication for approximately 1 month.  She is also scheduled to see a neurologist in [place], but has been unable to get in touch with the office to make an appointment.  She denies fever, chills, abdominal pain, urine or bowel changes.  No recent injuries.      Past Medical History:  Diagnosis Date  . Anxiety   . Endometriosis   . Hx of chlamydia infection   . Pericarditis    age 75  . Scoliosis     Patient Active Problem List   Diagnosis Date Noted  . S/P laparoscopic assisted vaginal hysterectomy (LAVH) 07/22/2016  . Hx of chlamydia infection 04/12/2013    Past Surgical History:  Procedure Laterality Date  . CARDIAC CATHETERIZATION    . LAPAROSCOPIC UNILATERAL SALPINGECTOMY Right 07/22/2016   Procedure: LAPAROSCOPIC RIGHT SALPINGECTOMY;  Surgeon: Florian Buff, MD;  Location: [facility];  Service: Gynecology;  Laterality: Right;  . LAPAROSCOPIC VAGINAL HYSTERECTOMY WITH SALPINGO OOPHORECTOMY Left 07/22/2016   Procedure:  LAPAROSCOPIC ASSISTED VAGINAL HYSTERECTOMY LEFT WITH SALPINGO OOPHORECTOMY;  Surgeon: Florian Buff, MD;  Location: [facility];  Service: Gynecology;  Laterality: Left;  . TUBAL LIGATION       OB History    Gravida  2   Para  2   Term  2   Preterm      AB      Living  2     SAB      TAB      Ectopic      Multiple      Live Births  2           Family History  Problem Relation Age of Onset  . Hypertension Other   . Cancer Other   . Diabetes Other   . Thyroid disease Other   . Hypertension Father   . Sleep apnea Father   . Hypertension Mother   . Sleep apnea Mother   . Cancer Mother 46       stomach and breast   . Hyperlipidemia Mother   . Diabetes Mother   . Asthma Sister   . Hypertension Sister   . Asthma Son   . Cancer Paternal Grandmother        bone,stomach  . Hyperlipidemia Maternal Grandmother   . Hypertension Maternal Grandmother   . Thyroid disease Maternal Grandmother   . Diabetes Maternal Grandfather     Social History   Tobacco Use  .  Smoking status: Former Smoker    Packs/day: 0.25    Years: 18.00    Pack years: 4.50    Types: Cigarettes    Quit date: 01/10/2019    Years since quitting: 1.0  . Smokeless tobacco: Never Used  Substance Use Topics  . Alcohol use: No  . Drug use: No    Home Medications Prior to Admission medications   Medication Sig Start Date End Date Taking? Authorizing Provider  FLUoxetine (PROZAC) 20 MG capsule Take 20 mg by mouth. 07/14/19 07/13/20 Yes [provider]  cetirizine (ZYRTEC) 10 MG tablet Take 10 mg by mouth daily. 04/13/19   [provider]  clonazePAM (KLONOPIN) 1 MG tablet Take 1 mg by mouth daily as needed for anxiety.  05/25/19   [provider]  FLUoxetine (PROZAC) 40 MG capsule Take 40 mg by mouth daily.    [provider]  fluticasone (FLONASE) 50 MCG/ACT nasal spray Place 2 sprays into both nostrils daily as needed for allergies or rhinitis.    [provider]  Melatonin 3 MG TABS Take 3 mg by mouth at bedtime as needed (for sleep).  02/09/19   [provider]  promethazine (PHENERGAN) 25 MG tablet Take 1 tablet (25 mg total) by mouth every 6 (six) hours as needed for nausea or vomiting. 07/10/19   Evalee Jefferson, PA-C    Allergies    Iodine, Shellfish allergy, and Motrin [ibuprofen]  Review of Systems   Review of Systems  Constitutional: Negative for chills and fever.  Respiratory: Negative for cough, chest tightness and shortness of breath.   Cardiovascular: Negative for chest pain.  Gastrointestinal: Negative for abdominal pain, diarrhea, nausea and vomiting.  Genitourinary: Positive for frequency. Negative for difficulty urinating, dysuria, flank pain, vaginal bleeding and vaginal discharge.  Musculoskeletal: Positive for arthralgias and back pain. Negative for joint swelling, neck pain and neck stiffness.  Skin: Negative for color change and wound.  Neurological: Positive for numbness (numbness and tingling to bilateral feet and lower legs. ). Negative for weakness.    Physical Exam Updated Vital Signs BP 139/79 (BP Location: Left Arm)   Pulse 98   Temp 98.5 F (36.9 C) (Oral)   Resp 18   Ht 5\' 4"  (1.626 m)   Wt 74.8 kg   LMP 07/03/2016 Comment: 07/22/16  SpO2 100%   BMI 28.32 kg/m   Physical Exam Vitals and nursing note reviewed.  Constitutional:      General: She is not in acute distress.    Appearance: Normal appearance. She is well-developed. She is not ill-appearing.  HENT:     Head: Normocephalic and atraumatic.  Cardiovascular:     Rate and Rhythm: Normal rate and regular rhythm.     Pulses: Normal pulses.     Comments: DP pulses are strong and palpable bilaterally Pulmonary:     Effort: Pulmonary effort is normal. No respiratory distress.     Breath sounds: Normal breath sounds.  Abdominal:     General: There is no distension.     Palpations: Abdomen is soft.     Tenderness: There is no  abdominal tenderness.  Musculoskeletal:        General: Tenderness present. No swelling or deformity.     Cervical back: Normal range of motion and neck supple.     Lumbar back: Tenderness present. No swelling, deformity or lacerations. Normal range of motion.     Comments: ttp of the mid to lower lumbar spine and right paraspinal  muscles.  Pt has 5/5 strength against resistance of bilateral lower extremities.  Hip flexors and extensors are intact   Skin:    General: Skin is warm and dry.     Capillary Refill: Capillary refill takes less than 2 seconds.     Findings: No rash.  Neurological:     Mental Status: She is alert and oriented to person, place, and time.     Sensory: No sensory deficit.     Motor: No abnormal muscle tone.     Coordination: Coordination normal.     Gait: Gait normal.     Deep Tendon Reflexes:     Reflex Scores:      Patellar reflexes are 2+ on the right side and 2+ on the left side.      Achilles reflexes are 2+ on the right side and 2+ on the left side.    ED Results / Procedures / Treatments   Labs (all labs ordered are listed, but only abnormal results are displayed) Labs Reviewed  URINALYSIS, ROUTINE W REFLEX MICROSCOPIC - Abnormal; Notable for the following components:      Result Value   Hgb urine dipstick SMALL (*)    Bacteria, UA RARE (*)    All other components within normal limits    EKG None  Radiology DG Lumbar Spine Complete  Result Date: 01/18/2020 CLINICAL DATA:  Back pain history of spina bifida and scoliosis EXAM: LUMBAR SPINE - COMPLETE 4+ VIEW COMPARISON:  None. FINDINGS: Incomplete fusion posterior elements at L5. Lumbar alignment is normal. Disc spaces and vertebral body heights are normal. IMPRESSION: Negative. Electronically Signed   By: Donavan Foil M.D.   On: 01/18/2020 17:48    Procedures Procedures (including critical care time)  Medications Ordered in ED Medications - No data to display  ED Course  I have reviewed  the triage vital signs and the nursing notes.  Pertinent labs & imaging results that were available during my care of the patient were reviewed by me and considered in my medical decision making (see chart for details).    MDM Rules/Calculators/A&P                      Patient with likely acute on chronic low back pain.  She does have reported history of scoliosis.  She is neurovascularly intact.  I have witnessed her ambulating in the department and gait is slow but steady.  No foot drop.  No concerning symptoms for cauda equina.  Urinalysis also reassuring.  I feel the patient is appropriate for discharge home.  I feel that she benefit from follow-up with neurology.  I will start her on a short course of pain medication and prednisone.     Final Clinical Impression(s) / ED Diagnoses Final diagnoses:  Lumbar radicular pain    Rx / DC Orders ED Discharge Orders    None       Kem Parkinson, PA-C 01/18/20 1956    Daleen Bo, MD 01/22/20 [phone]

## 2020-01-18 NOTE — Discharge Instructions (Addendum)
Alternate ice and heat to your lower back.  Continue taking your gabapentin as directed.  Try to follow-up with your referred neurologist or you may contact the one listed above.  Return to the ER for any worsening symptoms.

## 2020-03-12 ENCOUNTER — Ambulatory Visit: Payer: Medicaid Other | Admitting: Obstetrics & Gynecology

## 2020-05-08 ENCOUNTER — Other Ambulatory Visit: Payer: Self-pay | Admitting: Neurology

## 2020-05-08 ENCOUNTER — Other Ambulatory Visit (HOSPITAL_COMMUNITY): Payer: Self-pay | Admitting: Neurology

## 2020-05-08 DIAGNOSIS — G35 Multiple sclerosis: Secondary | ICD-10-CM

## 2020-05-30 ENCOUNTER — Other Ambulatory Visit: Payer: Self-pay

## 2020-05-30 ENCOUNTER — Ambulatory Visit (HOSPITAL_COMMUNITY)
Admission: RE | Admit: 2020-05-30 | Discharge: 2020-05-30 | Disposition: A | Discharge status: Home / Self Care | Payer: Medicaid Other | Source: Ambulatory Visit | Attending: Neurology | Admitting: Neurology

## 2020-05-30 DIAGNOSIS — G35 Multiple sclerosis: Secondary | ICD-10-CM

## 2020-09-01 ENCOUNTER — Other Ambulatory Visit: Payer: Self-pay

## 2020-09-01 ENCOUNTER — Emergency Department (HOSPITAL_COMMUNITY)
Admission: EM | Admit: 2020-09-01 | Discharge: 2020-09-01 | Disposition: A | Discharge status: Home / Self Care | Payer: Medicaid Other | Attending: Emergency Medicine | Admitting: Emergency Medicine

## 2020-09-01 ENCOUNTER — Encounter (HOSPITAL_COMMUNITY): Payer: Self-pay

## 2020-09-01 DIAGNOSIS — M79672 Pain in left foot: Secondary | ICD-10-CM | POA: Insufficient documentation

## 2020-09-01 DIAGNOSIS — M722 Plantar fascial fibromatosis: Secondary | ICD-10-CM | POA: Diagnosis not present

## 2020-09-01 DIAGNOSIS — M79605 Pain in left leg: Secondary | ICD-10-CM | POA: Insufficient documentation

## 2020-09-01 DIAGNOSIS — Z79899 Other long term (current) drug therapy: Secondary | ICD-10-CM | POA: Diagnosis not present

## 2020-09-01 DIAGNOSIS — F17210 Nicotine dependence, cigarettes, uncomplicated: Secondary | ICD-10-CM | POA: Diagnosis not present

## 2020-09-01 DIAGNOSIS — M79671 Pain in right foot: Secondary | ICD-10-CM | POA: Diagnosis present

## 2020-09-01 MED ORDER — IBUPROFEN 600 MG PO TABS: 600.0000 mg | ORAL_TABLET | Freq: Four times a day (QID) | ORAL | 0 refills | Status: DC | PRN

## 2020-09-01 NOTE — ED Notes (Signed)
Pt reports bilateral foot pain for 2 years   Has been followed by Dr Presley Raddle for same with workup to "see why I have this pain"  Pain has increased in the last week  No call to md  Here for reported pain that has increased in the last 2 days

## 2020-09-01 NOTE — ED Triage Notes (Signed)
Pt to er, pt states that she is here for foot pain, states that she has had pain for the past two year.  Currently seeing MD [place] and is unable to find a reason for her foot pain.  States that her pain got much worse about two days ago and is now going up her legs into her back.

## 2020-09-01 NOTE — ED Provider Notes (Signed)
[facility] EMERGENCY DEPARTMENT Provider Note   CSN: [id number] Arrival date & time: 09/01/20  1645     History Chief Complaint  Patient presents with  . Foot Pain    Desiree Pope is a 39 y.o. female.  The history is provided by the patient. No language interpreter was used.  Foot Pain     39 year old female significant history of scoliosis, anxiety, presenting for evaluation of foot pain.  Patient report for nearly 2 years since she has had recurrent pain to both of her feet.  She describes her pain as a burning stabbing sharp achy sensation to the soles of her feet radiates towards the top of her feet and now radiates up to her legs.  Pain is moderate in severity, worse in the morning when she gets up and when she walks and persist throughout the day.  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  She reported being seen by her PCP for this as well as been seen by her neurologist.  States she has had MRI, x-rays, nerve conduction test but there was no definitive finding.  She has been prescribed several different medication without adequate relief.  She is here due to progressive worsening pain.  She denies any recent injury.  She denies any fever chills or rash.  She has changed her shoes multiple times and try different therapy without relief.  Past Medical History:  Diagnosis Date  . Anxiety   . Endometriosis   . Hx of chlamydia infection   . Pericarditis    age 16  . Scoliosis     Patient Active Problem List   Diagnosis Date Noted  . S/P laparoscopic assisted vaginal hysterectomy (LAVH) 07/22/2016  . Hx of chlamydia infection 04/12/2013    Past Surgical History:  Procedure Laterality Date  . ABDOMINAL HYSTERECTOMY    . CARDIAC CATHETERIZATION    . LAPAROSCOPIC UNILATERAL SALPINGECTOMY Right 07/22/2016   Procedure: LAPAROSCOPIC RIGHT SALPINGECTOMY;  Surgeon: Florian Buff, MD;  Location: [facility];  Service: Gynecology;  Laterality: Right;  .  LAPAROSCOPIC VAGINAL HYSTERECTOMY WITH SALPINGO OOPHORECTOMY Left 07/22/2016   Procedure: LAPAROSCOPIC ASSISTED VAGINAL HYSTERECTOMY LEFT WITH SALPINGO OOPHORECTOMY;  Surgeon: Florian Buff, MD;  Location: [facility];  Service: Gynecology;  Laterality: Left;  . TUBAL LIGATION       OB History    Gravida  2   Para  2   Term  2   Preterm      AB      Living  2     SAB      TAB      Ectopic      Multiple      Live Births  2           Family History  Problem Relation Age of Onset  . Hypertension Other   . Cancer Other   . Diabetes Other   . Thyroid disease Other   . Hypertension Father   . Sleep apnea Father   . Hypertension Mother   . Sleep apnea Mother   . Cancer Mother 17       stomach and breast   . Hyperlipidemia Mother   . Diabetes Mother   . Asthma Sister   . Hypertension Sister   . Asthma Son   . Cancer Paternal Grandmother        bone,stomach  . Hyperlipidemia Maternal Grandmother   . Hypertension Maternal  Grandmother   . Thyroid disease Maternal Grandmother   . Diabetes Maternal Grandfather     Social History   Tobacco Use  . Smoking status: Current Every Day Smoker    Packs/day: 0.25    Years: 18.00    Pack years: 4.50    Types: Cigarettes  . Smokeless tobacco: Never Used  Vaping Use  . Vaping Use: Never used  Substance Use Topics  . Alcohol use: No  . Drug use: No    Home Medications Prior to Admission medications   Medication Sig Start Date End Date Taking? Authorizing Provider  cetirizine (ZYRTEC) 10 MG tablet Take 10 mg by mouth daily. 04/13/19   [provider]  clonazePAM (KLONOPIN) 1 MG tablet Take 1 mg by mouth daily as needed for anxiety.  05/25/19   [provider]  FLUoxetine (PROZAC) 20 MG capsule Take 20 mg by mouth. 07/14/19 07/13/20  [provider]  FLUoxetine (PROZAC) 40 MG capsule Take 40 mg by mouth daily.    [provider]  fluticasone (FLONASE) 50 MCG/ACT nasal spray Place 2  sprays into both nostrils daily as needed for allergies or rhinitis.    [provider]  HYDROcodone-acetaminophen (NORCO/VICODIN) 5-325 MG tablet Take one tab po q 4 hrs prn pain 01/18/20   Triplett, Tammy, PA-C  Melatonin 3 MG TABS Take 3 mg by mouth at bedtime as needed (for sleep).  02/09/19   [provider]  predniSONE (DELTASONE) 10 MG tablet Take 6 tablets day one, 5 tablets day two, 4 tablets day three, 3 tablets day four, 2 tablets day five, then 1 tablet day six 01/18/20   Triplett, Tammy, PA-C  promethazine (PHENERGAN) 25 MG tablet Take 1 tablet (25 mg total) by mouth every 6 (six) hours as needed for nausea or vomiting. 07/10/19   Evalee Jefferson, PA-C    Allergies    Iodine, Shellfish allergy, and Motrin [ibuprofen]  Review of Systems   Review of Systems  Constitutional: Negative for fever.  Musculoskeletal: Positive for arthralgias.  Skin: Negative for wound.  Neurological: Negative for numbness.    Physical Exam Updated Vital Signs BP 131/85 (BP Location: Right Arm)   Pulse 84   Temp 97.8 F (36.6 C) (Oral)   Resp 18   Ht 5\' 4"  (1.626 m)   Wt 74.8 kg   LMP 07/03/2016 Comment: 07/22/16  SpO2 100%   BMI 28.32 kg/m   Physical Exam Vitals and nursing note reviewed.  Constitutional:      General: She is not in acute distress.    Appearance: She is well-developed.  HENT:     Head: Atraumatic.  Eyes:     Conjunctiva/sclera: Conjunctivae normal.  Musculoskeletal:        General: Tenderness (Normal appearance bilateral lower extremities with intact pedal pulses and normal skin color with brisk cap refill.  Mild tenderness to the soles of each feet on palpation but with full range of motion.) present.     Cervical back: Neck supple.     Comments: Bilateral calves are nontender.  No peripheral edema.  Skin:    Findings: No rash.  Neurological:     Mental Status: She is alert.     ED Results / Procedures / Treatments   Labs (all labs ordered are  listed, but only abnormal results are displayed) Labs Reviewed - No data to display  EKG None  Radiology No results found.  Procedures Procedures (including critical care time)  Medications Ordered in ED  Medications - No data to display  ED Course  I have reviewed the triage vital signs and the nursing notes.  Pertinent labs & imaging results that were available during my care of the patient were reviewed by me and considered in my medical decision making (see chart for details).    MDM Rules/Calculators/A&P                          BP 131/85 (BP Location: Right Arm)   Pulse 84   Temp 97.8 F (36.6 C) (Oral)   Resp 18   Ht 5\' 4"  (1.626 m)   Wt 74.8 kg   LMP 07/03/2016 Comment: 07/22/16  SpO2 100%   BMI 28.32 kg/m   Final Clinical Impression(s) / ED Diagnoses Final diagnoses:  Plantar fasciitis of right foot  Plantar fasciitis of left foot    Rx / DC Orders ED Discharge Orders         Ordered    ibuprofen (ADVIL) 600 MG tablet  Every 6 hours PRN        09/01/20 1826         6:24 PM Patient here with bilateral feet pain radiates up her legs which has been an ongoing issue for approximately 2 years.  Report have been evaluated by a neurologist for her symptoms and has had MRI, x-rays and other testing including EMG without any definitive diagnosis.  Her symptoms suggestive of plantar fasciitis.  I do not see any evidence of infection.  Low suspicion for fracture or dislocation.  She is neurovascular intact low suspicion for DVT.  No significant back pain on exam.  Will refer to orthopedist for further care.  Will provide NSAIDs.   Domenic Moras, PA-C 09/01/20 1833    Maudie Flakes, MD 09/01/20 [phone]

## 2020-09-01 NOTE — ED Notes (Signed)
Went to get a wheelchair to take pt out to lobby   Upon return pt not in room

## 2020-09-01 NOTE — Discharge Instructions (Addendum)
Your foot pain may be due to plantar fasciitis.  Please call and follow-up with orthopedist for further evaluation and management.  Follow instruction below for care.

## 2020-09-10 ENCOUNTER — Telehealth: Payer: Self-pay | Admitting: Orthopaedic Surgery

## 2020-09-10 NOTE — Telephone Encounter (Signed)
Patient called to inquire about appointment following 09/01/20 emergency room visit at [facility] (date which our providers were not on call). Offered appointment however patient's coverage is one that [facility] is out of network. Michela Pitcher will contact a provider within network.

## 2020-09-12 ENCOUNTER — Other Ambulatory Visit: Payer: Self-pay

## 2020-09-12 ENCOUNTER — Emergency Department (HOSPITAL_COMMUNITY)
Admission: EM | Admit: 2020-09-12 | Discharge: 2020-09-13 | Disposition: A | Discharge status: Home / Self Care | Payer: Medicaid Other | Attending: Emergency Medicine | Admitting: Emergency Medicine

## 2020-09-12 ENCOUNTER — Encounter (HOSPITAL_COMMUNITY): Payer: Self-pay | Admitting: *Deleted

## 2020-09-12 DIAGNOSIS — F339 Major depressive disorder, recurrent, unspecified: Secondary | ICD-10-CM | POA: Diagnosis present

## 2020-09-12 DIAGNOSIS — Z20822 Contact with and (suspected) exposure to covid-19: Secondary | ICD-10-CM | POA: Diagnosis not present

## 2020-09-12 DIAGNOSIS — F17210 Nicotine dependence, cigarettes, uncomplicated: Secondary | ICD-10-CM | POA: Insufficient documentation

## 2020-09-12 DIAGNOSIS — F332 Major depressive disorder, recurrent severe without psychotic features: Secondary | ICD-10-CM | POA: Insufficient documentation

## 2020-09-12 HISTORY — DX: Depression, unspecified: F32.A

## 2020-09-12 LAB — CBC WITH DIFFERENTIAL/PLATELET
Abs Immature Granulocytes: 0.02 10*3/uL (ref 0.00–0.07)
Basophils Absolute: 0.1 10*3/uL (ref 0.0–0.1)
Basophils Relative: 1 %
Eosinophils Absolute: 0.3 10*3/uL (ref 0.0–0.5)
Eosinophils Relative: 4 %
HCT: 38.8 % (ref 36.0–46.0)
Hemoglobin: 12.4 g/dL (ref 12.0–15.0)
Immature Granulocytes: 0 %
Lymphocytes Relative: 29 %
Lymphs Abs: 2.2 10*3/uL (ref 0.7–4.0)
MCH: 27.4 pg (ref 26.0–34.0)
MCHC: 32 g/dL (ref 30.0–36.0)
MCV: 85.8 fL (ref 80.0–100.0)
Monocytes Absolute: 0.6 10*3/uL (ref 0.1–1.0)
Monocytes Relative: 7 %
Neutro Abs: 4.6 10*3/uL (ref 1.7–7.7)
Neutrophils Relative %: 59 %
Platelets: 369 10*3/uL (ref 150–400)
RBC: 4.52 MIL/uL (ref 3.87–5.11)
RDW: 14.6 % (ref 11.5–15.5)
WBC: 7.7 10*3/uL (ref 4.0–10.5)
nRBC: 0 % (ref 0.0–0.2)

## 2020-09-12 LAB — BASIC METABOLIC PANEL
Anion gap: 8 (ref 5–15)
BUN: 9 mg/dL (ref 6–20)
CO2: 27 mmol/L (ref 22–32)
Calcium: 8.9 mg/dL (ref 8.9–10.3)
Chloride: 104 mmol/L (ref 98–111)
Creatinine, Ser: 0.69 mg/dL (ref 0.44–1.00)
GFR calc Af Amer: >60 mL/min (ref >60)
GFR calc non Af Amer: >60 mL/min (ref >60)
Glucose, Bld: 95 mg/dL (ref 70–99)
Potassium: 4.5 mmol/L (ref 3.5–5.1)
Sodium: 139 mmol/L (ref 135–145)

## 2020-09-12 LAB — RAPID URINE DRUG SCREEN, HOSP PERFORMED
Amphetamines: NOT DETECTED
Barbiturates: NOT DETECTED
Benzodiazepines: NOT DETECTED
Cocaine: NOT DETECTED
Opiates: NOT DETECTED
Tetrahydrocannabinol: NOT DETECTED

## 2020-09-12 LAB — ETHANOL: Alcohol, Ethyl (B): <10 mg/dL (ref <10)

## 2020-09-12 LAB — SARS CORONAVIRUS 2 BY RT PCR (HOSPITAL ORDER, PERFORMED IN ~~LOC~~ HOSPITAL LAB): SARS Coronavirus 2: NEGATIVE

## 2020-09-12 MED ORDER — LORAZEPAM 1 MG PO TABS
1.0000 mg | ORAL_TABLET | Freq: Once | ORAL | Status: AC
  Administered 2020-09-12: 1 mg via ORAL
  Filled 2020-09-12: qty 1

## 2020-09-12 NOTE — ED Notes (Signed)
Pt ambulated to the bathroom with a steady gait.

## 2020-09-12 NOTE — ED Triage Notes (Signed)
Pt with depression and anxiety for past 2 yrs. But for the past month her depression is worse.  +SI and plan to drive down the steepest hill.  Husband brought pt here today.  Denies any drug or ETOH use.

## 2020-09-12 NOTE — ED Notes (Addendum)
Pt is tearful at this time. Reports anxiety due to the noise in the department and states it gives her uncontrollable panic attacks.
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
## 2020-09-13 ENCOUNTER — Other Ambulatory Visit: Payer: Self-pay

## 2020-09-13 ENCOUNTER — Inpatient Hospital Stay (HOSPITAL_COMMUNITY)
Admission: AD | Admit: 2020-09-13 | Discharge: 2020-09-16 | DRG: 885 | Disposition: A | Discharge status: Home / Self Care | Payer: Medicaid Other | Source: Intra-hospital | Attending: Psychiatry | Admitting: Psychiatry

## 2020-09-13 ENCOUNTER — Encounter (HOSPITAL_COMMUNITY): Payer: Self-pay | Admitting: Behavioral Health

## 2020-09-13 DIAGNOSIS — Z87892 Personal history of anaphylaxis: Secondary | ICD-10-CM

## 2020-09-13 DIAGNOSIS — G4700 Insomnia, unspecified: Secondary | ICD-10-CM | POA: Diagnosis present

## 2020-09-13 DIAGNOSIS — Z818 Family history of other mental and behavioral disorders: Secondary | ICD-10-CM | POA: Diagnosis not present

## 2020-09-13 DIAGNOSIS — Z79890 Hormone replacement therapy (postmenopausal): Secondary | ICD-10-CM | POA: Diagnosis not present

## 2020-09-13 DIAGNOSIS — F17210 Nicotine dependence, cigarettes, uncomplicated: Secondary | ICD-10-CM | POA: Diagnosis present

## 2020-09-13 DIAGNOSIS — Z888 Allergy status to other drugs, medicaments and biological substances status: Secondary | ICD-10-CM | POA: Diagnosis not present

## 2020-09-13 DIAGNOSIS — R45851 Suicidal ideations: Secondary | ICD-10-CM | POA: Diagnosis present

## 2020-09-13 DIAGNOSIS — G43909 Migraine, unspecified, not intractable, without status migrainosus: Secondary | ICD-10-CM | POA: Diagnosis present

## 2020-09-13 DIAGNOSIS — Z91013 Allergy to seafood: Secondary | ICD-10-CM

## 2020-09-13 DIAGNOSIS — F332 Major depressive disorder, recurrent severe without psychotic features: Secondary | ICD-10-CM | POA: Diagnosis present

## 2020-09-13 DIAGNOSIS — F411 Generalized anxiety disorder: Secondary | ICD-10-CM | POA: Diagnosis present

## 2020-09-13 DIAGNOSIS — Z915 Personal history of self-harm: Secondary | ICD-10-CM | POA: Diagnosis not present

## 2020-09-13 DIAGNOSIS — Z79899 Other long term (current) drug therapy: Secondary | ICD-10-CM | POA: Diagnosis not present

## 2020-09-13 MED ORDER — HYDROXYZINE HCL 25 MG PO TABS
25.0000 mg | ORAL_TABLET | Freq: Four times a day (QID) | ORAL | Status: DC | PRN
  Administered 2020-09-13 – 2020-09-14 (×2): 25 mg via ORAL
  Filled 2020-09-13 (×2): qty 1

## 2020-09-13 MED ORDER — TRAZODONE HCL 50 MG PO TABS
50.0000 mg | ORAL_TABLET | Freq: Every evening | ORAL | Status: DC | PRN
  Administered 2020-09-14: 50 mg via ORAL
  Filled 2020-09-13: qty 1

## 2020-09-13 MED ORDER — ALUM & MAG HYDROXIDE-SIMETH 200-200-20 MG/5ML PO SUSP: 30.0000 mL | ORAL | Status: DC | PRN

## 2020-09-13 MED ORDER — MAGNESIUM HYDROXIDE 400 MG/5ML PO SUSP: 30.0000 mL | Freq: Every day | ORAL | Status: DC | PRN

## 2020-09-13 MED ORDER — DULOXETINE HCL 30 MG PO CPEP
30.0000 mg | ORAL_CAPSULE | Freq: Every day | ORAL | Status: DC
  Administered 2020-09-13: 30 mg via ORAL
  Filled 2020-09-13: qty 1

## 2020-09-13 MED ORDER — HYDROXYZINE HCL 25 MG PO TABS
50.0000 mg | ORAL_TABLET | Freq: Three times a day (TID) | ORAL | Status: DC | PRN
  Administered 2020-09-13: 50 mg via ORAL
  Filled 2020-09-13: qty 2

## 2020-09-13 MED ORDER — ACETAMINOPHEN 325 MG PO TABS
650.0000 mg | ORAL_TABLET | Freq: Once | ORAL | Status: AC
  Administered 2020-09-13: 650 mg via ORAL
  Filled 2020-09-13: qty 2

## 2020-09-13 MED ORDER — ACETAMINOPHEN 325 MG PO TABS
650.0000 mg | ORAL_TABLET | Freq: Four times a day (QID) | ORAL | Status: DC | PRN
  Administered 2020-09-14: 650 mg via ORAL
  Filled 2020-09-13: qty 2

## 2020-09-13 MED ORDER — GABAPENTIN 100 MG PO CAPS
100.0000 mg | ORAL_CAPSULE | Freq: Three times a day (TID) | ORAL | Status: DC
  Administered 2020-09-13 (×2): 100 mg via ORAL
  Filled 2020-09-13 (×2): qty 1

## 2020-09-13 MED ORDER — ONDANSETRON 4 MG PO TBDP
4.0000 mg | ORAL_TABLET | Freq: Once | ORAL | Status: AC
  Administered 2020-09-13: 4 mg via ORAL
  Filled 2020-09-13: qty 1

## 2020-09-13 NOTE — ED Notes (Signed)
Call for report as no call has been received   Unable to speak with Will, RN

## 2020-09-13 NOTE — ED Provider Notes (Signed)
4:00 AM I have overheard nurse telling patient that due to what she said in triage if she tried to leave the ED we would most likely have to fill out IVC papers and have the please bring her back.  5:30 AM patient was asking to talk to me.  She asked if she can just go home and call her doctor in the morning.  I advised her that she could not with what she said.  And I also informed her I would be filling out her IVC papers and holding them at my desk and if she tried to leave I would send him to the magistrate and have the IVC papers notarized.  6:30 AM IVC papers have been filled out.  They are sitting at the desk.  TTS consult is still pending.   Rolland Porter, MD 09/13/20 [phone]

## 2020-09-13 NOTE — BH Assessment (Signed)
Per spouse's request, he was phoned with update on pt's disposition for inpt psychiatric tx.

## 2020-09-13 NOTE — ED Notes (Signed)
Pt inquiring about trazodone dose. Trazadone not listed on pt home medication list. Pharmacy consulted regarding need for updated medication reconciliation.

## 2020-09-13 NOTE — ED Notes (Signed)
Pt informed of recommended inpt

## 2020-09-13 NOTE — ED Notes (Signed)
Patient to shower

## 2020-09-13 NOTE — Progress Notes (Signed)
Adult Psychoeducational Group Note  Date:  09/13/2020 Time:  8:28 PM  Group Topic/Focus:  Wrap-Up Group:   The focus of this group is to help patients review their daily goal of treatment and discuss progress on daily workbooks.  Participation Level:  Active  Participation Quality:  Appropriate  Affect:  Appropriate  Cognitive:  Appropriate  Insight: Appropriate  Engagement in Group:  Engaged  Modes of Intervention:  Discussion  Additional Comments:  Patient attended [place] group meeting and participated.   Desiree Pope 8/36/5427, 8:28 PM

## 2020-09-13 NOTE — BH Assessment (Signed)
Desiree Rankin, NP recommends inpt psychiatric tx

## 2020-09-13 NOTE — ED Notes (Signed)
Pt evaluated 6 hours ago   Continues to await recommendation   Call to TTS who reports "will get to it today"

## 2020-09-13 NOTE — Plan of Care (Signed)
Newly admitted  and was oriented to the unit. Complained of anxiety and restlessness. Received Vistaril but continues to experience restlessness. Provider was notified. Patient denies thoughts of self harm but continues to report that she needs to manage her mood swings. Patient took a shower. Currently in room. Safety monitored as recommended.
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
## 2020-09-13 NOTE — ED Notes (Signed)
Report to [place], Therapist, sports
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
## 2020-09-13 NOTE — ED Notes (Signed)
Pt admitted to [facility] will call back

## 2020-09-13 NOTE — BH Assessment (Signed)
Patient accepted to [facility] by Earleen Newport, NP. The attending provider is Dr. Mallie Darting. Room assignment is 306-1. Nurse Report [phone]. Requesting nursing staff to have patient sign voluntary consent prior to transfer to [facility]. Patient may transport to [facility] asap. [place], RN-Charge Nurse notified.

## 2020-09-14 LAB — CBC
HCT: 40.4 % (ref 36.0–46.0)
Hemoglobin: 13.3 g/dL (ref 12.0–15.0)
MCH: 27.4 pg (ref 26.0–34.0)
MCHC: 32.9 g/dL (ref 30.0–36.0)
MCV: 83.3 fL (ref 80.0–100.0)
Platelets: 362 10*3/uL (ref 150–400)
RBC: 4.85 MIL/uL (ref 3.87–5.11)
RDW: 14.6 % (ref 11.5–15.5)
WBC: 7.2 10*3/uL (ref 4.0–10.5)
nRBC: 0 % (ref 0.0–0.2)

## 2020-09-14 LAB — HEPATIC FUNCTION PANEL
ALT: 21 U/L (ref 0–44)
AST: 23 U/L (ref 15–41)
Albumin: 4 g/dL (ref 3.5–5.0)
Alkaline Phosphatase: 101 U/L (ref 38–126)
Bilirubin, Direct: <0.1 mg/dL (ref 0.0–0.2)
Total Bilirubin: 0.5 mg/dL (ref 0.3–1.2)
Total Protein: 7.1 g/dL (ref 6.5–8.1)

## 2020-09-14 LAB — LIPID PANEL
Cholesterol: 249 mg/dL — ABNORMAL HIGH (ref 0–200)
HDL: 50 mg/dL (ref >40)
LDL Cholesterol: 157 mg/dL — ABNORMAL HIGH (ref 0–99)
Total CHOL/HDL Ratio: 5 RATIO
Triglycerides: 208 mg/dL — ABNORMAL HIGH (ref <150)
VLDL: 42 mg/dL — ABNORMAL HIGH (ref 0–40)

## 2020-09-14 LAB — COMPREHENSIVE METABOLIC PANEL
ALT: 21 U/L (ref 0–44)
AST: 24 U/L (ref 15–41)
Albumin: 4.1 g/dL (ref 3.5–5.0)
Alkaline Phosphatase: 103 U/L (ref 38–126)
Anion gap: 12 (ref 5–15)
BUN: 12 mg/dL (ref 6–20)
CO2: 21 mmol/L — ABNORMAL LOW (ref 22–32)
Calcium: 9 mg/dL (ref 8.9–10.3)
Chloride: 103 mmol/L (ref 98–111)
Creatinine, Ser: 0.71 mg/dL (ref 0.44–1.00)
GFR calc Af Amer: >60 mL/min (ref >60)
GFR calc non Af Amer: >60 mL/min (ref >60)
Glucose, Bld: 94 mg/dL (ref 70–99)
Potassium: 3.9 mmol/L (ref 3.5–5.1)
Sodium: 136 mmol/L (ref 135–145)
Total Bilirubin: 0.4 mg/dL (ref 0.3–1.2)
Total Protein: 7.4 g/dL (ref 6.5–8.1)

## 2020-09-14 LAB — ETHANOL: Alcohol, Ethyl (B): <10 mg/dL (ref <10)

## 2020-09-14 LAB — TSH: TSH: 2.859 u[IU]/mL (ref 0.350–4.500)

## 2020-09-14 LAB — MAGNESIUM: Magnesium: 2 mg/dL (ref 1.7–2.4)

## 2020-09-14 LAB — PREGNANCY, URINE: Preg Test, Ur: NEGATIVE

## 2020-09-14 MED ORDER — TRAMADOL HCL 50 MG PO TABS
50.0000 mg | ORAL_TABLET | Freq: Four times a day (QID) | ORAL | Status: DC | PRN
  Administered 2020-09-16: 50 mg via ORAL
  Filled 2020-09-14: qty 1

## 2020-09-14 MED ORDER — ONDANSETRON HCL 4 MG PO TABS
ORAL_TABLET | ORAL | Status: AC
  Filled 2020-09-14: qty 1

## 2020-09-14 MED ORDER — OXCARBAZEPINE 150 MG PO TABS
75.0000 mg | ORAL_TABLET | Freq: Two times a day (BID) | ORAL | Status: DC
  Administered 2020-09-14 – 2020-09-16 (×4): 75 mg via ORAL
  Filled 2020-09-14 (×3): qty 0.5
  Filled 2020-09-14: qty 1
  Filled 2020-09-14 (×3): qty 0.5

## 2020-09-14 MED ORDER — GABAPENTIN 400 MG PO CAPS
400.0000 mg | ORAL_CAPSULE | Freq: Three times a day (TID) | ORAL | Status: DC
  Administered 2020-09-14 – 2020-09-16 (×7): 400 mg via ORAL
  Filled 2020-09-14 (×12): qty 1

## 2020-09-14 MED ORDER — ONDANSETRON HCL 4 MG PO TABS
4.0000 mg | ORAL_TABLET | Freq: Three times a day (TID) | ORAL | Status: DC | PRN
  Administered 2020-09-14: 4 mg via ORAL

## 2020-09-14 MED ORDER — DULOXETINE HCL 60 MG PO CPEP
60.0000 mg | ORAL_CAPSULE | Freq: Every day | ORAL | Status: DC
  Administered 2020-09-14 – 2020-09-16 (×3): 60 mg via ORAL
  Filled 2020-09-14 (×5): qty 1

## 2020-09-14 MED ORDER — HYDROXYZINE HCL 50 MG PO TABS
50.0000 mg | ORAL_TABLET | Freq: Three times a day (TID) | ORAL | Status: DC | PRN
  Administered 2020-09-14 – 2020-09-16 (×5): 50 mg via ORAL
  Filled 2020-09-14 (×5): qty 1

## 2020-09-14 NOTE — Progress Notes (Signed)
D. Pt presents with a flat affect- calm, cooperative behavior- c/o nerve pain 9/10 bilat feet. Per pt's self inventory, pt rated her depression, hopelessness and anxiety a 5/0/9, respectively.  Pt wrote that her goal today is "anxiety, grieve and outburst", and that she will "talk" to help her to accomplish this goal. Pt currently denies SI/HI and AVH and doesn't appear to be responding to internal stimuli/  A. Labs and vitals monitored. Pt given and educated on medications. Pt supported emotionally and encouraged to express concerns and ask questions.   R. Pt remains safe with 15 minute checks. Will continue POC.
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
## 2020-09-14 NOTE — Progress Notes (Signed)
[facility] NOVEL CORONAVIRUS (COVID-19) DAILY CHECK-OFF SYMPTOMS - answer yes or no to each - every day NO YES  Have you had a fever in the past 24 hours?  . Fever (Temp > 37.80C / 100F) X   Have you had any of these symptoms in the past 24 hours? . New Cough .  Sore Throat  .  Shortness of Breath .  Difficulty Breathing .  Unexplained Body Aches   X   Have you had any one of these symptoms in the past 24 hours not related to allergies?   . Runny Nose .  Nasal Congestion .  Sneezing   X   If you have had runny nose, nasal congestion, sneezing in the past 24 hours, has it worsened?  X   EXPOSURES - check yes or no X   Have you traveled outside the state in the past 14 days?  X   Have you been in contact with someone with a confirmed diagnosis of COVID-19 or PUI in the past 14 days without wearing appropriate PPE?  X   Have you been living in the same home as a person with confirmed diagnosis of COVID-19 or a PUI (household contact)?    X   Have you been diagnosed with COVID-19?    X              What to do next: Answered NO to all: Answered YES to anything:   Proceed with unit schedule Follow the [facility] Inpatient Flowsheet.   

## 2020-09-14 NOTE — [facility] Group Notes (Signed)
.  Psychoeducational Group Note    Date: 09-14-2020 Time: 1300-1400    Life Skills: This group discusses  Purpose of Group: . The group focus' on teaching patients on how to identify their needs and how to develop the coping skills needed to get their needs met  Participation Level:  Active  Participation Quality:  Appropriate  Affect:  Appropriate  Cognitive:  Oriented  Insight:  Improving  Engagement in Group:  Engaged  Additional Comments:  Pt rates her energy level at a 7.  States that she has angry outbursts. Her goal today is to allow "myself to just BE and to be able to focus on my grief and the loss of my grandmother".Marveen Reeks, Elnora Morrison

## 2020-09-15 DIAGNOSIS — F332 Major depressive disorder, recurrent severe without psychotic features: Principal | ICD-10-CM

## 2020-09-15 LAB — PROLACTIN: Prolactin: 17.5 ng/mL (ref 4.8–23.3)

## 2020-09-15 MED ORDER — TRAZODONE HCL 50 MG PO TABS
50.0000 mg | ORAL_TABLET | Freq: Every evening | ORAL | Status: DC | PRN
  Administered 2020-09-15: 50 mg via ORAL
  Filled 2020-09-15: qty 1

## 2020-09-15 MED ORDER — SUMATRIPTAN SUCCINATE 25 MG PO TABS
25.0000 mg | ORAL_TABLET | Freq: Once | ORAL | Status: AC
  Administered 2020-09-15: 25 mg via ORAL
  Filled 2020-09-15 (×2): qty 1

## 2020-09-15 NOTE — Progress Notes (Signed)
[facility] Group Notes:  (Nursing/MHT/Case Management/Adjunct)  Date:  09/15/2020  Time:  2015  Type of Therapy:  wrap up group  Participation Level:  Active  Participation Quality:  Appropriate, Attentive, Sharing and Supportive  Affect:  Anxious  Cognitive:  Appropriate  Insight:  Improving  Engagement in Group:  Engaged  Modes of Intervention:  Clarification, Education and Support  Summary of Progress/Problems: Positive thinking and positive change were discussed.   Desiree Pope 09/15/2020, 9:46 PM

## 2020-09-15 NOTE — Progress Notes (Signed)
   09/15/20 2306  Psych Admission Type (Psych Patients Only)  Admission Status Voluntary  Psychosocial Assessment  Eye Contact Fair  Facial Expression Anxious;Sad  Affect Anxious;Depressed;Sad  Speech Logical/coherent  Interaction Assertive  Motor Activity Restless  Appearance/Hygiene Unremarkable  Aggressive Behavior  Effect No apparent injury  Thought Process  Coherency WDL  Content WDL  Delusions WDL  Perception WDL  Hallucination None reported or observed  Judgment Poor  Confusion WDL  Danger to Self  Current suicidal ideation? Denies  Danger to Others  Danger to Others None reported or observed  D: Patient in dayroom reports she had a good day after taking a nap this afternoon. Pt reports issues with anger but has been able to keep it under control.  A: Medications administered as prescribed. Support and encouragement provided as needed.  R: Patient remains safe on the unit. Will continue to monitor for safety and stability.

## 2020-09-15 NOTE — Progress Notes (Signed)
[facility] MD Progress Note  09/15/2020 1:25 PM Desiree Pope  MRN:  [id number] Subjective:  "I'm doing well."  Ms. Desiree Pope found sitting in the dayroom. She appears euthymic and reports mood is improved today. She states depression and anxiety have both significantly improved. She feels medication adjustments and group therapy have both been helpful. She plans to take an hour of each day for self-care when she returns home to help with depression/anxiety. She denies any SI/HI/AVH. She has been visible and active in the milieu.  After she was seen this morning, patient requested medication for a migraine. She reports prior history of migraines and takes PRN medication at home, which she believes is Imitrex. One time order of Imitrex given.  From admission H&P: Patient has a 39 year old female with a reported past psychiatric history significant for generalized anxiety and depression who presented to the [facility] emergency department on 09/12/2020 with depression and anxiety for the last 2 years. She admitted to suicidal ideation. Her plan was to find the steepest hill to drive down and to kill her self.   Principal Problem: <principal problem not specified> Diagnosis: Active Problems:   MDD (major depressive disorder), recurrent episode, severe ([place])  Total Time spent with patient: 15 minutes  Past Psychiatric History: See admission H&P  Past Medical History:  Past Medical History:  Diagnosis Date  . Anxiety   . Anxiety   . Depression   . Endometriosis   . Hx of chlamydia infection   . Pericarditis    age 17  . Scoliosis     Past Surgical History:  Procedure Laterality Date  . ABDOMINAL HYSTERECTOMY    . CARDIAC CATHETERIZATION    . LAPAROSCOPIC UNILATERAL SALPINGECTOMY Right 07/22/2016   Procedure: LAPAROSCOPIC RIGHT SALPINGECTOMY;  Surgeon: Florian Buff, MD;  Location: [facility];  Service: Gynecology;  Laterality: Right;  . LAPAROSCOPIC VAGINAL HYSTERECTOMY WITH SALPINGO OOPHORECTOMY  Left 07/22/2016   Procedure: LAPAROSCOPIC ASSISTED VAGINAL HYSTERECTOMY LEFT WITH SALPINGO OOPHORECTOMY;  Surgeon: Florian Buff, MD;  Location: [facility];  Service: Gynecology;  Laterality: Left;  . TUBAL LIGATION     Family History:  Family History  Problem Relation Age of Onset  . Hypertension Other   . Cancer Other   . Diabetes Other   . Thyroid disease Other   . Hypertension Father   . Sleep apnea Father   . Hypertension Mother   . Sleep apnea Mother   . Cancer Mother 39       stomach and breast   . Hyperlipidemia Mother   . Diabetes Mother   . Asthma Sister   . Hypertension Sister   . Asthma Son   . Cancer Paternal Grandmother        bone,stomach  . Hyperlipidemia Maternal Grandmother   . Hypertension Maternal Grandmother   . Thyroid disease Maternal Grandmother   . Diabetes Maternal Grandfather    Family Psychiatric  History: See admission H&P Social History:  Social History   Substance and Sexual Activity  Alcohol Use No     Social History   Substance and Sexual Activity  Drug Use No    Social History   Socioeconomic History  . Marital status: Married    Spouse name: Not on file  . Number of children: Not on file  . Years of education: Not on file  . Highest education level: Not on file  Occupational History  . Not on file  Tobacco Use  . Smoking status: Current Every  Day Smoker    Packs/day: 0.25    Years: 18.00    Pack years: 4.50    Types: Cigarettes  . Smokeless tobacco: Never Used  Vaping Use  . Vaping Use: Never used  Substance and Sexual Activity  . Alcohol use: No  . Drug use: No  . Sexual activity: Yes    Birth control/protection: Surgical    Comment: hyst  Other Topics Concern  . Not on file  Social History Narrative  . Not on file   Social Determinants of Health   Financial Resource Strain:   . Difficulty of Paying Living Expenses: Not on file  Food Insecurity:   . Worried About Charity fundraiser in the Last Year: Not on  file  . Ran Out of Food in the Last Year: Not on file  Transportation Needs:   . Lack of Transportation (Medical): Not on file  . Lack of Transportation (Non-Medical): Not on file  Physical Activity:   . Days of Exercise per Week: Not on file  . Minutes of Exercise per Session: Not on file  Stress:   . Feeling of Stress : Not on file  Social Connections:   . Frequency of Communication with Friends and Family: Not on file  . Frequency of Social Gatherings with Friends and Family: Not on file  . Attends Religious Services: Not on file  . Active Member of Clubs or Organizations: Not on file  . Attends Archivist Meetings: Not on file  . Marital Status: Not on file   Additional Social History:                         Sleep: Good  Appetite:  Good  Current Medications: Current Facility-Administered Medications  Medication Dose Route Frequency Provider Last Rate Last Admin  . acetaminophen (TYLENOL) tablet 650 mg  650 mg Oral Q6H PRN Caroline Sauger, NP   650 mg at 09/14/20 2326  . alum & mag hydroxide-simeth (MAALOX/MYLANTA) 200-200-20 MG/5ML suspension 30 mL  30 mL Oral Q4H PRN Caroline Sauger, NP      . DULoxetine (CYMBALTA) DR capsule 60 mg  60 mg Oral Daily Sharma Covert, MD   60 mg at 09/15/20 0754  . gabapentin (NEURONTIN) capsule 400 mg  400 mg Oral TID Sharma Covert, MD   400 mg at 09/15/20 1242  . hydrOXYzine (ATARAX/VISTARIL) tablet 50 mg  50 mg Oral TID PRN Sharma Covert, MD   50 mg at 09/15/20 0754  . magnesium hydroxide (MILK OF MAGNESIA) suspension 30 mL  30 mL Oral Daily PRN Caroline Sauger, NP      . ondansetron [facility]) tablet 4 mg  4 mg Oral Q8H PRN Sharma Covert, MD   4 mg at 09/14/20 1427  . OXcarbazepine (TRILEPTAL) tablet 75 mg  75 mg Oral BID Sharma Covert, MD   75 mg at 09/15/20 0753  . traMADol (ULTRAM) tablet 50 mg  50 mg Oral Q6H PRN Sharma Covert, MD      . traZODone (DESYREL) tablet 50 mg  50  mg Oral QHS PRN Caroline Sauger, NP   50 mg at 09/14/20 2111    Lab Results:  Results for orders placed or performed during the hospital encounter of 09/13/20 (from the past 48 hour(s))  CBC     Status: None   Collection Time: 09/14/20  6:36 AM  Result Value Ref Range   WBC 7.2 4.0 -  10.5 K/uL   RBC 4.85 3.87 - 5.11 MIL/uL   Hemoglobin 13.3 12.0 - 15.0 g/dL   HCT 40.4 36 - 46 %   MCV 83.3 80.0 - 100.0 fL   MCH 27.4 26.0 - 34.0 pg   MCHC 32.9 30.0 - 36.0 g/dL   RDW 14.6 11.5 - 15.5 %   Platelets 362 150 - 400 K/uL   nRBC 0.0 0.0 - 0.2 %    Comment: Performed at [facility], [place] [address], [place], [place] [address]  Comprehensive metabolic panel     Status: Abnormal   Collection Time: 09/14/20  6:36 AM  Result Value Ref Range   Sodium 136 135 - 145 mmol/L   Potassium 3.9 3.5 - 5.1 mmol/L   Chloride 103 98 - 111 mmol/L   CO2 21 (L) 22 - 32 mmol/L   Glucose, Bld 94 70 - 99 mg/dL    Comment: Glucose reference range applies only to samples taken after fasting for at least 8 hours.   BUN 12 6 - 20 mg/dL   Creatinine, Ser 0.71 0.44 - 1.00 mg/dL   Calcium 9.0 8.9 - 10.3 mg/dL   Total Protein 7.4 6.5 - 8.1 g/dL   Albumin 4.1 3.5 - 5.0 g/dL   AST 24 15 - 41 U/L   ALT 21 0 - 44 U/L   Alkaline Phosphatase 103 38 - 126 U/L   Total Bilirubin 0.4 0.3 - 1.2 mg/dL   GFR calc non Af Amer >60 >60 mL/min   GFR calc Af Amer >60 >60 mL/min   Anion gap 12 5 - 15    Comment: Performed at [facility], [place] [address] [place], [place] [address]  Magnesium     Status: None   Collection Time: 09/14/20  6:36 AM  Result Value Ref Range   Magnesium 2.0 1.7 - 2.4 mg/dL    Comment: Performed at [facility], [place] [address], [place], [place] [address]  Ethanol     Status: None   Collection Time: 09/14/20  6:36 AM  Result Value Ref Range   Alcohol, Ethyl (B) <10 <10 mg/dL    Comment: (NOTE) Lowest detectable limit for serum alcohol is  10 mg/dL.  For medical purposes only. Performed at [facility], [place] [address] [place], [place] [address]   Lipid panel     Status: Abnormal   Collection Time: 09/14/20  6:36 AM  Result Value Ref Range   Cholesterol 249 (H) 0 - 200 mg/dL   Triglycerides 208 (H) <150 mg/dL   HDL 50 >40 mg/dL   Total CHOL/HDL Ratio 5.0 RATIO   VLDL 42 (H) 0 - 40 mg/dL   LDL Cholesterol 157 (H) 0 - 99 mg/dL    Comment:        Total Cholesterol/HDL:CHD Risk Coronary Heart Disease Risk Table                     Men   Women  1/2 Average Risk   3.4   3.3  Average Risk       5.0   4.4  2 X Average Risk   9.6   7.1  3 X Average Risk  23.4   11.0        Use the calculated Patient Ratio above and the CHD Risk Table to determine the patient's CHD Risk.        ATP III CLASSIFICATION (LDL):  <100  mg/dL   Optimal  100-129  mg/dL   Near or Above                    Optimal  130-159  mg/dL   Borderline  160-189  mg/dL   High  >190     mg/dL   Very High Performed at [place] [address], [place], [place] [address]   Hepatic function panel     Status: None   Collection Time: 09/14/20  6:36 AM  Result Value Ref Range   Total Protein 7.1 6.5 - 8.1 g/dL   Albumin 4.0 3.5 - 5.0 g/dL   AST 23 15 - 41 U/L   ALT 21 0 - 44 U/L   Alkaline Phosphatase 101 38 - 126 U/L   Total Bilirubin 0.5 0.3 - 1.2 mg/dL   Bilirubin, Direct <0.1 0.0 - 0.2 mg/dL   Indirect Bilirubin NOT CALCULATED 0.3 - 0.9 mg/dL    Comment: Performed at [facility], [place] [address], [place], [place] [address]  TSH     Status: None   Collection Time: 09/14/20  6:36 AM  Result Value Ref Range   TSH 2.859 0.350 - 4.500 uIU/mL    Comment: Performed by a 3rd Generation assay with a functional sensitivity of <=0.01 uIU/mL. Performed at [facility], [place] [address], [place], [place] [address]   Pregnancy, urine     Status: None   Collection Time: 09/14/20   7:24 AM  Result Value Ref Range   Preg Test, Ur NEGATIVE NEGATIVE    Comment: Performed at [facility], [place] [address], [place], [place] [address]    Blood Alcohol level:  Lab Results  Component Value Date   ETH <10 09/14/2020   ETH <10 17/51/0258    Metabolic Disorder Labs: No results found for: HGBA1C, MPG No results found for: PROLACTIN Lab Results  Component Value Date   CHOL 249 (H) 09/14/2020   TRIG 208 (H) 09/14/2020   HDL 50 09/14/2020   CHOLHDL 5.0 09/14/2020   VLDL 42 (H) 09/14/2020   LDLCALC 157 (H) 09/14/2020    Physical Findings: AIMS:  , ,  ,  ,    CIWA:    COWS:     Musculoskeletal: Strength & Muscle Tone: within normal limits Gait & Station: normal Patient leans: N/A  Psychiatric Specialty Exam: Physical Exam Vitals and nursing note reviewed.  Constitutional:      Appearance: She is well-developed.  Cardiovascular:     Rate and Rhythm: Normal rate.  Pulmonary:     Effort: Pulmonary effort is normal.  Neurological:     Mental Status: She is alert and oriented to person, place, and time.     Review of Systems  Constitutional: Negative.   Respiratory: Negative for cough and shortness of breath.   Psychiatric/Behavioral: Negative for agitation, behavioral problems, confusion, decreased concentration, dysphoric mood, hallucinations, self-injury, sleep disturbance and suicidal ideas. The patient is not nervous/anxious and is not hyperactive.     Blood pressure 112/74, pulse 91, temperature 98.4 F (36.9 C), temperature source Oral, resp. rate 18, height 5\' 4"  (1.626 m), weight 75.3 kg, last menstrual period 07/03/2016, SpO2 99 %.Body mass index is 28.49 kg/m.  General Appearance: Casual  Eye Contact:  Good  Speech:  Normal Rate  Volume:  Normal  Mood:  Euthymic  Affect:  Appropriate and Congruent  Thought Process:  Coherent and Goal Directed  Orientation:  Full (  Time, Place, and Person)  Thought Content:  Logical   Suicidal Thoughts:  No  Homicidal Thoughts:  No  Memory:  Immediate;   Good Recent;   Good Remote;   Good  Judgement:  Intact  Insight:  Fair  Psychomotor Activity:  Normal  Concentration:  Concentration: Good and Attention Span: Good  Recall:  Good  Fund of Knowledge:  Good  Language:  Good  Akathisia:  No  Handed:  Right  AIMS (if indicated):     Assets:  Communication Skills Desire for Improvement Housing Resilience  ADL's:  Intact  Cognition:  WNL  Sleep:  Number of Hours: 6.75     Treatment Plan Summary: Daily contact with patient to assess and evaluate symptoms and progress in treatment and Medication management   Continue inpatient hospitalization.  Continue Cymbalta 60 mg PO daily for depression/anxiety Continue Neurontin 400 mg PO TID for pain/anxiety Continue Vistaril 50 mg PO TID PRN anxiety Continue Trileptal 75 mg PO BID for anxiety Continue tramadol 50 mg PO Q6HR PRN pain Continue trazodone 50 mg PO QHS PRN insomnia  Patient will participate in the therapeutic group milieu.  Discharge disposition in progress.   Connye Burkitt, NP 09/15/2020, 1:25 PM
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
## 2020-09-15 NOTE — Progress Notes (Signed)
DAR NOTE: Patient presents with anxious affect and depressed mood.  Denies pain, auditory and visual hallucinations. Complained of headace 9/10. Maintained on routine safety checks.  Medications given as prescribed.  Support and encouragement offered as needed.Will continue to monitor.

## 2020-09-15 NOTE — Progress Notes (Signed)
   09/15/20 0633  Vital Signs  Pulse Rate 91  BP 112/74  BP Location Left Arm  BP Method Automatic  Patient Position (if appropriate) Standing   D Patient denies SI/HI/AVH. Patient rated anxiety 6/10 and depression 2/10. Patient complained of HA Pain.  A:  Patient took scheduled medicine. Patient was prescribed Imitrex for migraine pain and reported some relief.  Support and encouragement provided Routine safety checks conducted every 15 minutes. Patient  Informed to notify staff with any concerns.   R: Safety maintained.

## 2020-09-15 NOTE — BHH Group Notes (Signed)
Adult Psychoeducational Group Not Date:  09/15/2020 Time:  1000-1045 Group Topic/Focus: PROGRESSIVE RELAXATION. A group where deep breathing is taught and tensing and relaxation muscle groups is used. Imagery is used as well.  Pts are asked to imagine 3 pillars that hold them up when they are not able to hold themselves up.  Participation Level:  Active  Participation Quality:  Appropriate  Affect:  Appropriate  Cognitive:  Oriented  Insight: Improving  Engagement in Group:  Engaged  Modes of Intervention:  Activity, Discussion, Education, and Support  Additional Comments:  Pt rates her energy at a 9/10  States what supports her and holds her up is her family, her religion. She states that what she has learned is to never Desiree Pope a book by its cover  Desiree Pope 09/15/2020

## 2020-09-15 NOTE — BHH Group Notes (Signed)
Psychoeducational Group Note  Date:  09-15-20 Time:  1300  Group Topic/Focus:  Making Healthy Choices:   The focus of this group is to help patients identify negative/unhealthy choices they were using prior to admission and identify positive/healthier coping strategies to replace them upon discharge.  Participation Level:  Active  Participation Quality:  Appropriate  Affect:  Appropriate  Cognitive:  Oriented  Insight:  Improving  Engagement in Group:  Engaged  Additional Comments:  Rates her energy at a  At a 9/10. pAID CLOSE ATTENTION TO THE GROUP AND WAS ABLE TO ASK SPECIFICE AND REALISTIC QUESTIONS ABOUT THE TOPIC  Paulino Rily

## 2020-09-16 MED ORDER — GABAPENTIN 400 MG PO CAPS
400.0000 mg | ORAL_CAPSULE | Freq: Three times a day (TID) | ORAL | 0 refills | Status: DC
Start: 2020-09-16 — End: 2022-02-12

## 2020-09-16 MED ORDER — OXCARBAZEPINE 150 MG PO TABS: 75.0000 mg | ORAL_TABLET | Freq: Two times a day (BID) | ORAL | 0 refills | Status: DC

## 2020-09-16 MED ORDER — DULOXETINE HCL 60 MG PO CPEP
60.0000 mg | ORAL_CAPSULE | Freq: Every day | ORAL | 0 refills | Status: DC
Start: 2020-09-16 — End: 2022-02-12

## 2020-09-16 MED ORDER — HYDROXYZINE HCL 50 MG PO TABS
50.0000 mg | ORAL_TABLET | Freq: Three times a day (TID) | ORAL | 0 refills | Status: DC | PRN
Start: 2020-09-16 — End: 2022-02-12

## 2020-09-16 NOTE — Progress Notes (Signed)
Discharge Note:  Patient denies SI/HI AVH at this time. Discharge instructions, AVS, prescriptions and transition record gone over with patient. Patient agrees to comply with medication management, follow-up visit, and outpatient therapy. Patient belongings returned to patient. Patient questions and concerns addressed and answered.  Patient ambulatory off unit.  Patient discharged to home.   
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
## 2020-09-16 NOTE — [facility]HH Suicide Risk Assessment (Signed)
[facility] Discharge Suicide Risk Assessment   Principal Problem: <principal problem not specified> Discharge Diagnoses: Active Problems:   MDD (major depressive disorder), recurrent episode, severe ([place])   Total Time spent with patient: 15 minutes  Musculoskeletal: Strength & Muscle Tone: within normal limits Gait & Station: normal Patient leans: N/A  Psychiatric Specialty Exam: Review of Systems  Blood pressure 117/81, pulse 99, temperature 98.4 F (36.9 C), temperature source Oral, resp. rate 18, height 5\' 4"  (1.626 m), weight 75.3 kg, last menstrual period 07/03/2016, SpO2 99 %.Body mass index is 28.49 kg/m.  General Appearance: Casual  Eye Contact::  Good  Speech:  Normal Rate409  Volume:  Normal  Mood:  Euthymic  Affect:  Congruent  Thought Process:  Coherent and Descriptions of Associations: Intact  Orientation:  Full (Time, Place, and Person)  Thought Content:  Logical  Suicidal Thoughts:  No  Homicidal Thoughts:  No  Memory:  Immediate;   Good Recent;   Good Remote;   Good  Judgement:  Intact  Insight:  Fair  Psychomotor Activity:  Normal  Concentration:  Good  Recall:  Good  Fund of Knowledge:Good  Language: Good  Akathisia:  Negative  Handed:  Right  AIMS (if indicated):     Assets:  Desire for Improvement Housing Resilience Social Support Talents/Skills Transportation Vocational/Educational  Sleep:  Number of Hours: 6.5  Cognition: WNL  ADL's:  Intact   Mental Status Per Nursing Assessment::   On Admission:  NA  Demographic Factors:  Caucasian  Loss Factors: NA  Historical Factors: Impulsivity  Risk Reduction Factors:   Sense of responsibility to family and Living with another person, especially a relative  Continued Clinical Symptoms:  Severe Anxiety and/or Agitation Depression:   Impulsivity  Cognitive Features That Contribute To Risk:  None    Suicide Risk:  Minimal: No identifiable suicidal ideation.  Patients presenting with no  risk factors but with morbid ruminations; may be classified as minimal risk based on the severity of the depressive symptoms    Plan Of Care/Follow-up recommendations:  Activity:  ad lib  Sharma Covert, MD 09/16/2020, 9:17 AM

## 2020-09-16 NOTE — B[facility] Suicide Risk Assessment (Signed)
[place] INPATIENT:  Family/Significant Other Suicide Prevention Education  Suicide Prevention Education:  Contact Attempts: Yahnke,Curtis (Spouse)  [phone] (Mobile), (name of family member/significant other) has been identified by the patient as the family member/significant other with whom the patient will be residing, and identified as the person(s) who will aid the patient in the event of a mental health crisis.  With written consent from the patient, two attempts were made to provide suicide prevention education, prior to and/or following the patient's discharge.  We were unsuccessful in providing suicide prevention education.  A suicide education pamphlet was given to the patient to share with family/significant other.  Date and time of first attempt:09/16/20 at 945am Date and time of second attempt: 09/16/20 at 1017am  Voicemail left requesting return call.   Bethann Berkshire 09/16/2020, 10:17 AM

## 2020-09-16 NOTE — Tx Team (Signed)
Interdisciplinary Treatment and Diagnostic Plan Update  09/16/2020 Time of Session: 9:35am Desiree Pope MRN: [id number]  Principal Diagnosis: <principal problem not specified>  Secondary Diagnoses: Active Problems:   MDD (major depressive disorder), recurrent episode, severe ([facility])   Current Medications:  Current Facility-Administered Medications  Medication Dose Route Frequency Provider Last Rate Last Admin  . acetaminophen (TYLENOL) tablet 650 mg  650 mg Oral Q6H PRN Caroline Sauger, NP   650 mg at 09/14/20 2326  . alum & mag hydroxide-simeth (MAALOX/MYLANTA) 200-200-20 MG/5ML suspension 30 mL  30 mL Oral Q4H PRN Caroline Sauger, NP      . DULoxetine (CYMBALTA) DR capsule 60 mg  60 mg Oral Daily Sharma Covert, MD   60 mg at 09/16/20 0811  . gabapentin (NEURONTIN) capsule 400 mg  400 mg Oral TID Sharma Covert, MD   400 mg at 09/16/20 7948  . hydrOXYzine (ATARAX/VISTARIL) tablet 50 mg  50 mg Oral TID PRN Sharma Covert, MD   50 mg at 09/16/20 0811  . magnesium hydroxide (MILK OF MAGNESIA) suspension 30 mL  30 mL Oral Daily PRN Caroline Sauger, NP      . ondansetron [facility]) tablet 4 mg  4 mg Oral Q8H PRN Sharma Covert, MD   4 mg at 09/14/20 1427  . OXcarbazepine (TRILEPTAL) tablet 75 mg  75 mg Oral BID Sharma Covert, MD   75 mg at 09/16/20 0165  . traMADol (ULTRAM) tablet 50 mg  50 mg Oral Q6H PRN Sharma Covert, MD      . traZODone (DESYREL) tablet 50 mg  50 mg Oral QHS PRN,MR X 1 Connye Burkitt, NP   50 mg at 09/15/20 2208   PTA Medications: Medications Prior to Admission  Medication Sig Dispense Refill Last Dose  . butalbital-acetaminophen-caffeine (FIORICET) 50-325-40 MG tablet Take 1 tablet by mouth every 6 (six) hours as needed for headache.      . DULoxetine (CYMBALTA) 30 MG capsule Take 30-60 mg by mouth See admin instructions. 1 tablet may take 2 if needed     . gabapentin (NEURONTIN) 100 MG capsule Take 100 mg by mouth 3 (three)  times daily.     Marland Kitchen HYDROcodone-acetaminophen (NORCO/VICODIN) 5-325 MG tablet Take one tab po q 4 hrs prn pain (Patient not taking: Reported on 09/13/2020) 10 tablet 0   . hydrOXYzine (ATARAX/VISTARIL) 50 MG tablet Take 50 mg by mouth every 8 (eight) hours as needed for anxiety.     Marland Kitchen ibuprofen (ADVIL) 600 MG tablet Take 1 tablet (600 mg total) by mouth every 6 (six) hours as needed for moderate pain. (Patient not taking: Reported on 09/13/2020) 30 tablet 0   . Melatonin 3 MG TABS Take 3 mg by mouth at bedtime as needed (for sleep).      . predniSONE (DELTASONE) 10 MG tablet Take 6 tablets day one, 5 tablets day two, 4 tablets day three, 3 tablets day four, 2 tablets day five, then 1 tablet day six (Patient not taking: Reported on 09/13/2020) 21 tablet 0   . promethazine (PHENERGAN) 25 MG tablet Take 1 tablet (25 mg total) by mouth every 6 (six) hours as needed for nausea or vomiting. (Patient not taking: Reported on 09/13/2020) 30 tablet 0   . traMADol (ULTRAM) 50 MG tablet Take 50 mg by mouth every 6 (six) hours as needed for moderate pain.        Patient Stressors:    Patient Strengths:    Treatment Modalities: Medication  Management, Group therapy, Case management,  1 to 1 session with clinician, Psychoeducation, Recreational therapy.   Physician Treatment Plan for Primary Diagnosis: <principal problem not specified> Long Term Goal(s): Improvement in symptoms so as ready for discharge Improvement in symptoms so as ready for discharge   Short Term Goals: Ability to identify changes in lifestyle to reduce recurrence of condition will improve Ability to verbalize feelings will improve Ability to disclose and discuss suicidal ideas Ability to demonstrate self-control will improve Ability to identify and develop effective coping behaviors will improve  Medication Management: Evaluate patient's response, side effects, and tolerance of medication regimen.  Therapeutic Interventions: 1 to 1  sessions, Unit Group sessions and Medication administration.  Evaluation of Outcomes: Adequate for Discharge  Physician Treatment Plan for Secondary Diagnosis: Active Problems:   MDD (major depressive disorder), recurrent episode, severe ([place])  Long Term Goal(s): Improvement in symptoms so as ready for discharge Improvement in symptoms so as ready for discharge   Short Term Goals: Ability to identify changes in lifestyle to reduce recurrence of condition will improve Ability to verbalize feelings will improve Ability to disclose and discuss suicidal ideas Ability to demonstrate self-control will improve Ability to identify and develop effective coping behaviors will improve     Medication Management: Evaluate patient's response, side effects, and tolerance of medication regimen.  Therapeutic Interventions: 1 to 1 sessions, Unit Group sessions and Medication administration.  Evaluation of Outcomes: Adequate for Discharge   RN Treatment Plan for Primary Diagnosis: <principal problem not specified> Long Term Goal(s): Knowledge of disease and therapeutic regimen to maintain health will improve  Short Term Goals: Ability to remain free from injury will improve, Ability to verbalize frustration and anger appropriately will improve, Ability to identify and develop effective coping behaviors will improve and Compliance with prescribed medications will improve  Medication Management: RN will administer medications as ordered by provider, will assess and evaluate patient's response and provide education to patient for prescribed medication. RN will report any adverse and/or side effects to prescribing provider.  Therapeutic Interventions: 1 on 1 counseling sessions, Psychoeducation, Medication administration, Evaluate responses to treatment, Monitor vital signs and CBGs as ordered, Perform/monitor CIWA, COWS, AIMS and Fall Risk screenings as ordered, Perform wound care treatments as  ordered.  Evaluation of Outcomes: Adequate for Discharge   LCSW Treatment Plan for Primary Diagnosis: <principal problem not specified> Long Term Goal(s): Safe transition to appropriate next level of care at discharge, Engage patient in therapeutic group addressing interpersonal concerns.  Short Term Goals: Engage patient in aftercare planning with referrals and resources, Increase social support and Increase skills for wellness and recovery  Therapeutic Interventions: Assess for all discharge needs, 1 to 1 time with Social worker, Explore available resources and support systems, Assess for adequacy in community support network, Educate family and significant other(s) on suicide prevention, Complete Psychosocial Assessment, Interpersonal group therapy.  Evaluation of Outcomes: Adequate for Discharge   Progress in Treatment: Attending groups: Yes. Participating in groups: Yes. Taking medication as prescribed: Yes. Toleration medication: Yes. Family/Significant other contact made: No, will contact:  husband Patient understands diagnosis: Yes. Discussing patient identified problems/goals with staff: Yes. Medical problems stabilized or resolved: Yes. Denies suicidal/homicidal ideation: Yes. Issues/concerns per patient self-inventory: No.   New problem(s) identified: No, Describe:  none  New Short Term/Long Term Goal(s): medication stabilization, elimination of SI thoughts, development of comprehensive mental wellness plan.   Patient Goals:  "To say no, not be a people pleaser"  Discharge Plan or Barriers:  To return home and to follow up with established provider for medication management    Reason for Continuation of Hospitalization: Anxiety Depression Medication stabilization  Estimated Length of Stay: Adequate for Discharge  Attendees: Patient: Desiree Pope  09/16/2020   Physician:  09/16/2020   Nursing:  09/16/2020   RN Care Manager: 09/16/2020  Social Worker: Darletta Moll, [place] 09/16/2020   Recreational Therapist:  09/16/2020   Other: Baruch Gouty, NP 09/16/2020   Other: Verdis Frederickson, LCSW 09/16/2020   Other: 09/16/2020       Scribe for Treatment Team: Vassie Moselle, LCSW 09/16/2020 11:15 AM

## 2020-09-16 NOTE — Progress Notes (Signed)
  [facility] Adult Case Management Discharge Plan :  Will you be returning to the same living situation after discharge:  Yes,  Husband and child At discharge, do you have transportation home?: Yes,  husband Do you have the ability to pay for your medications: Yes,  medicaid  Release of information consent forms completed and in the chart;  Patient's signature needed at discharge.  Patient to Follow up at:  Follow-up Information    [facility] Follow up on 10/08/2020.   Why: You have an appointment on 10/08/20 at 3:40 pm  for medication management services.  This will be a Virtual video visit (if you need in person appointment, please call the provider but it will be later in October). Contact information: [address] [place] [address] [phone]               Next level of care provider has access to [facility] and Suicide Prevention discussed: Yes,  with pt. Husband attempted. Left voicemail  Have you used any form of tobacco in the last 30 days? (Cigarettes, Smokeless Tobacco, Cigars, and/or Pipes): Yes  Has patient been referred to the Quitline?: Patient refused referral  Patient has been referred for addiction treatment: [place], LCSW 09/16/2020, 10:02 AM

## 2020-09-16 NOTE — Progress Notes (Signed)
Recreation Therapy Notes  Date:  9.20.21 Time: 0930 Location: [address] Dayroom  Group Topic: Stress Management  Goal Area(s) Addresses:  Patient will identify positive stress management techniques. Patient will identify benefits of using stress management post d/c.  Behavioral Response: Engaged  Intervention: Stress Management  Activity:  Meditation.  LRT played a meditation that focused on making the most of your day and making the most of each moment.  Patients were to listen and follow as meditation played to fully engage in activity.    Education:  Stress Management, Discharge Planning.   Education Outcome: Acknowledges Education  Clinical Observations/Feedback: Pt attended and participated in activity.    Victorino Sparrow, LRT/CTRS         Ria Comment, [place] A 09/16/2020 11:02 AM

## 2021-07-23 ENCOUNTER — Emergency Department (HOSPITAL_COMMUNITY): Payer: Medicaid Other

## 2021-07-23 ENCOUNTER — Emergency Department (HOSPITAL_COMMUNITY)
Admission: EM | Admit: 2021-07-23 | Discharge: 2021-07-23 | Disposition: A | Discharge status: Home / Self Care | Payer: Medicaid Other | Attending: Emergency Medicine | Admitting: Emergency Medicine

## 2021-07-23 ENCOUNTER — Other Ambulatory Visit: Payer: Self-pay

## 2021-07-23 ENCOUNTER — Encounter (HOSPITAL_COMMUNITY): Payer: Self-pay | Admitting: *Deleted

## 2021-07-23 DIAGNOSIS — Y900 Blood alcohol level of less than 20 mg/100 ml: Secondary | ICD-10-CM | POA: Diagnosis not present

## 2021-07-23 DIAGNOSIS — R200 Anesthesia of skin: Secondary | ICD-10-CM | POA: Insufficient documentation

## 2021-07-23 DIAGNOSIS — G43809 Other migraine, not intractable, without status migrainosus: Secondary | ICD-10-CM

## 2021-07-23 DIAGNOSIS — I639 Cerebral infarction, unspecified: Secondary | ICD-10-CM | POA: Insufficient documentation

## 2021-07-23 DIAGNOSIS — G43109 Migraine with aura, not intractable, without status migrainosus: Secondary | ICD-10-CM

## 2021-07-23 DIAGNOSIS — Z20822 Contact with and (suspected) exposure to covid-19: Secondary | ICD-10-CM | POA: Insufficient documentation

## 2021-07-23 DIAGNOSIS — Z79899 Other long term (current) drug therapy: Secondary | ICD-10-CM | POA: Insufficient documentation

## 2021-07-23 DIAGNOSIS — R519 Headache, unspecified: Secondary | ICD-10-CM | POA: Insufficient documentation

## 2021-07-23 DIAGNOSIS — R110 Nausea: Secondary | ICD-10-CM | POA: Diagnosis not present

## 2021-07-23 DIAGNOSIS — R531 Weakness: Secondary | ICD-10-CM | POA: Diagnosis not present

## 2021-07-23 DIAGNOSIS — F17210 Nicotine dependence, cigarettes, uncomplicated: Secondary | ICD-10-CM | POA: Insufficient documentation

## 2021-07-23 LAB — RAPID URINE DRUG SCREEN, HOSP PERFORMED
Amphetamines: NOT DETECTED
Barbiturates: NOT DETECTED
Benzodiazepines: NOT DETECTED
Cocaine: NOT DETECTED
Opiates: NOT DETECTED
Tetrahydrocannabinol: NOT DETECTED

## 2021-07-23 LAB — ETHANOL: Alcohol, Ethyl (B): <10 mg/dL (ref <10)

## 2021-07-23 LAB — URINALYSIS, ROUTINE W REFLEX MICROSCOPIC
Bilirubin Urine: NEGATIVE
Glucose, UA: NEGATIVE mg/dL
Ketones, ur: NEGATIVE mg/dL
Leukocytes,Ua: NEGATIVE
Nitrite: NEGATIVE
Protein, ur: NEGATIVE mg/dL
Specific Gravity, Urine: 1.011 (ref 1.005–1.030)
pH: 6 (ref 5.0–8.0)

## 2021-07-23 LAB — RESP PANEL BY RT-PCR (FLU A&B, COVID) ARPGX2
Influenza A by PCR: NEGATIVE
Influenza B by PCR: NEGATIVE
SARS Coronavirus 2 by RT PCR: NEGATIVE

## 2021-07-23 LAB — COMPREHENSIVE METABOLIC PANEL
ALT: 13 U/L (ref 0–44)
AST: 17 U/L (ref 15–41)
Albumin: 3.6 g/dL (ref 3.5–5.0)
Alkaline Phosphatase: 78 U/L (ref 38–126)
Anion gap: 6 (ref 5–15)
BUN: 9 mg/dL (ref 6–20)
CO2: 22 mmol/L (ref 22–32)
Calcium: 8.5 mg/dL — ABNORMAL LOW (ref 8.9–10.3)
Chloride: 105 mmol/L (ref 98–111)
Creatinine, Ser: 0.77 mg/dL (ref 0.44–1.00)
GFR, Estimated: >60 mL/min (ref >60)
Glucose, Bld: 134 mg/dL — ABNORMAL HIGH (ref 70–99)
Potassium: 3.4 mmol/L — ABNORMAL LOW (ref 3.5–5.1)
Sodium: 133 mmol/L — ABNORMAL LOW (ref 135–145)
Total Bilirubin: 0.5 mg/dL (ref 0.3–1.2)
Total Protein: 6.4 g/dL — ABNORMAL LOW (ref 6.5–8.1)

## 2021-07-23 LAB — I-STAT CHEM 8, ED
BUN: 8 mg/dL (ref 6–20)
Calcium, Ion: 1.14 mmol/L — ABNORMAL LOW (ref 1.15–1.40)
Chloride: 107 mmol/L (ref 98–111)
Creatinine, Ser: 0.7 mg/dL (ref 0.44–1.00)
Glucose, Bld: 121 mg/dL — ABNORMAL HIGH (ref 70–99)
HCT: 34 % — ABNORMAL LOW (ref 36.0–46.0)
Hemoglobin: 11.6 g/dL — ABNORMAL LOW (ref 12.0–15.0)
Potassium: 3.7 mmol/L (ref 3.5–5.1)
Sodium: 139 mmol/L (ref 135–145)
TCO2: 22 mmol/L (ref 22–32)

## 2021-07-23 LAB — APTT: aPTT: 32 seconds (ref 24–36)

## 2021-07-23 LAB — CBG MONITORING, ED: Glucose-Capillary: 149 mg/dL — ABNORMAL HIGH (ref 70–99)

## 2021-07-23 LAB — CBC
HCT: 35.8 % — ABNORMAL LOW (ref 36.0–46.0)
Hemoglobin: 12 g/dL (ref 12.0–15.0)
MCH: 28.9 pg (ref 26.0–34.0)
MCHC: 33.5 g/dL (ref 30.0–36.0)
MCV: 86.3 fL (ref 80.0–100.0)
Platelets: 333 10*3/uL (ref 150–400)
RBC: 4.15 MIL/uL (ref 3.87–5.11)
RDW: 13.7 % (ref 11.5–15.5)
WBC: 9.2 10*3/uL (ref 4.0–10.5)
nRBC: 0 % (ref 0.0–0.2)

## 2021-07-23 LAB — DIFFERENTIAL
Abs Immature Granulocytes: 0.02 10*3/uL (ref 0.00–0.07)
Basophils Absolute: 0 10*3/uL (ref 0.0–0.1)
Basophils Relative: 0 %
Eosinophils Absolute: 0.2 10*3/uL (ref 0.0–0.5)
Eosinophils Relative: 2 %
Immature Granulocytes: 0 %
Lymphocytes Relative: 24 %
Lymphs Abs: 2.3 10*3/uL (ref 0.7–4.0)
Monocytes Absolute: 0.6 10*3/uL (ref 0.1–1.0)
Monocytes Relative: 7 %
Neutro Abs: 6.1 10*3/uL (ref 1.7–7.7)
Neutrophils Relative %: 67 %

## 2021-07-23 LAB — POC URINE PREG, ED: Preg Test, Ur: NEGATIVE

## 2021-07-23 LAB — PROTIME-INR
INR: 0.9 (ref 0.8–1.2)
Prothrombin Time: 12.6 seconds (ref 11.4–15.2)

## 2021-07-23 MED ORDER — OXYCODONE-ACETAMINOPHEN 5-325 MG PO TABS
1.0000 | ORAL_TABLET | Freq: Once | ORAL | Status: AC
  Administered 2021-07-23: 1 via ORAL
  Filled 2021-07-23: qty 1

## 2021-07-23 MED ORDER — ASPIRIN 300 MG RE SUPP: 300.0000 mg | Freq: Once | RECTAL | Status: AC

## 2021-07-23 MED ORDER — DIPHENHYDRAMINE HCL 50 MG/ML IJ SOLN
25.0000 mg | Freq: Once | INTRAMUSCULAR | Status: AC
  Administered 2021-07-23: 25 mg via INTRAVENOUS
  Filled 2021-07-23: qty 1

## 2021-07-23 MED ORDER — ASPIRIN EC 325 MG PO TBEC
325.0000 mg | DELAYED_RELEASE_TABLET | Freq: Once | ORAL | Status: AC
  Administered 2021-07-23: 325 mg via ORAL
  Filled 2021-07-23: qty 1

## 2021-07-23 MED ORDER — LORAZEPAM 2 MG/ML IJ SOLN
1.0000 mg | Freq: Once | INTRAMUSCULAR | Status: AC
  Administered 2021-07-23: 1 mg via INTRAVENOUS
  Filled 2021-07-23: qty 1

## 2021-07-23 MED ORDER — SODIUM CHLORIDE 0.9 % IV BOLUS
30.0000 mL/kg | Freq: Once | INTRAVENOUS | Status: AC
  Administered 2021-07-23: 2040 mL via INTRAVENOUS

## 2021-07-23 MED ORDER — METOCLOPRAMIDE HCL 5 MG/ML IJ SOLN
10.0000 mg | Freq: Once | INTRAMUSCULAR | Status: AC
  Administered 2021-07-23: 10 mg via INTRAVENOUS
  Filled 2021-07-23: qty 2

## 2021-07-23 MED ORDER — PROCHLORPERAZINE EDISYLATE 10 MG/2ML IJ SOLN
10.0000 mg | Freq: Once | INTRAMUSCULAR | Status: AC
  Administered 2021-07-23: 10 mg via INTRAVENOUS
  Filled 2021-07-23: qty 2

## 2021-07-23 NOTE — ED Notes (Signed)
Pt ambulatory to restroom without complication. 

## 2021-07-23 NOTE — ED Notes (Signed)
Patient returned to room at this time with Neurologist on screen to assess patient at this time.

## 2021-07-23 NOTE — ED Triage Notes (Signed)
Pt brought in by [facility] with c/o headache, right arm and leg weakness, tingling to right fingers that started at 1220. Pt reports hx of same symptoms x 3. CBG 116, NSR on monitor, no facial droop per EMS. Dr. Langston Masker called to bedside.

## 2021-07-23 NOTE — ED Notes (Addendum)
Patient requesting meds for MRI.

## 2021-07-23 NOTE — Consult Note (Signed)
[facility] Telemedicine Consult   Requesting Provider: Dr. Langston Masker  Chief Complaint: Headache, right side arm and leg numbness heaviness  HPI: 40 year old female with history of anxiety, depression, migraine headache, TIA versus complicated migraine presented to ED for headache, right arm and leg numbness tingling, dizziness.  Per patient, she was at outside around 12:30 PM when she had sudden onset lightheadedness, dizziness, feeling some spinning sensation, and same time she had headache bifrontal and behind eyes, throbbing with nausea but no vomiting.  She walked inside the house, feeding right arm and thumb numbness tingling and then right leg numbness tingling.  In ER, she was found to have some heaviness on the right arm and leg with pain.  CT no acute abnormality.  BP 122/80, glucose 149.  Her home medication including Cymbalta, Trileptal, but no seizure history.  Reviewing her chart, she had episode of questionable right-sided weakness and problems speaking on 06/14/2011.  Head CT normal and MRI no acute abnormality, no stroke.  She follows with Dr. Merlene Laughter and had MRI in 05/2020 for headache, depression, bilateral numbness for 8 months, MRI no stroke but with a few nonspecific punctate T2 hyperintensities.  LKW: 12:30 PM tpa given?: No, none disabling symptoms and likely not stroke IR Thrombectomy? No, low NIHSS and no LVO sign, likely not stroke Modified Rankin Scale: 0-Completely asymptomatic and back to baseline post- stroke   Exam: Vitals:   07/23/21 1415 07/23/21 1430  BP: 122/80 120/90  Pulse: 80   Resp: 20 16  SpO2: 100%      Pulse Rate:  [80] 80 (07/27 1415) Resp:  [16-20] 16 (07/27 1430) BP: (120-122)/(80-90) 120/90 (07/27 1430) SpO2:  [100 %] 100 % (07/27 1415) Weight:  [68 kg] 68 kg (07/27 1408)  General - Well nourished, well developed, mildly lethargic.  Ophthalmologic - fundi not visualized due to noncooperation.  Cardiovascular - Regular  rhythm and rate.  Neuro - awake, alert, mildly lethargic, photophobia, but able to open eyes as requested, orientated to age, place, time and people. No aphasia, but slow and soft voice, following all simple commands. Able to name and repeat and read.  Mild psychomotor slowing.  No gaze palsy, tracking bilaterally, visual field full, PERRL. No facial droop. Tongue midline.  Initially with right arm drift and pain, however with encouragement patient was able to hold right arm for 10 seconds without drift.  Left upper extremity no drift, bilateral lower extremity no drift, however still has pain on right lower extremity lifting up.  Sensation decreased on the right face arm and leg, arm more than leg.  Finger-to-nose intact bilaterally, slower on the right.   NIH Stroke Scale  Level Of Consciousness 0=Alert; keenly responsive 1=Arouse to minor stimulation 2=Requires repeated stimulation to arouse or movements to pain 3=postures or unresponsive 1  LOC Questions to Month and Age 47=Answers both questions correctly 1=Answers one question correctly or dysarthria/intubated/trauma/language barrier 2=Answers neither question correctly or aphasia 0  LOC Commands      -Open/Close eyes     -Open/close grip     -Pantomime commands if communication barrier 0=Performs both tasks correctly 1=Performs one task correctly 2=Performs neighter task correctly 0  Best Gaze     -Only assess horizontal gaze 0=Normal 1=Partial gaze palsy 2=Forced deviation, or total gaze paresis 0  Visual 0=No visual loss 1=Partial hemianopia 2=Complete hemianopia 3=Bilateral hemianopia (blind including cortical blindness) 0  Facial Palsy     -Use grimace if obtunded 0=Normal symmetrical movement 1=Minor paralysis (asymmetry) 2=Partial  paralysis (lower face) 3=Complete paralysis (upper and lower face) 0  Motor  0=No drift for 10/5 seconds 1=Drift, but does not hit bed 2=Some antigravity effort, hits  bed 3=No effort  against gravity, limb falls 4=No movement 0=Amputation/joint fusion Right Arm 0     Leg 0    Left Arm 0     Leg 0  Limb Ataxia     - FNT/HTS 0=Absent or does not understand or paralyzed or amputation/joint fusion 1=Present in one limb 2=Present in two limbs 0  Sensory 0=Normal 1=Mild to moderate sensory loss 2=Severe to total sensory loss or coma/unresponsive 1  Best Language 0=No aphasia, normal 1=Mild to moderate aphasia 2=Severe aphasia 3=Mute, global aphasia, or coma/unresponsive 0  Dysarthria 0=Normal 1=Mild to moderate 2=Severe, unintelligible or mute/anarthric 0=intubated/unable to test 0  Extinction/Neglect 0=No abnormality 1=visual/tactile/auditory/spatia/personal inattention/Extinction to bilateral simultaneous stimulation 2=Profound neglect/extinction more than 1 modality  0  Total   2      Imaging Reviewed:  CT HEAD CODE STROKE WO CONTRAST  Result Date: 07/23/2021 CLINICAL DATA:  No deficit, acute, stroke suspected; right-sided numbness EXAM: CT HEAD WITHOUT CONTRAST TECHNIQUE: Contiguous axial images were obtained from the base of the skull through the vertex without intravenous contrast. COMPARISON:  Correlation made with MRI brain 05/30/2020 FINDINGS: Brain: There is no acute intracranial hemorrhage, mass effect, or edema. Gray-white differentiation is preserved. Ventricles and sulci are normal in size and configuration. No extra-axial collection. Vascular: No hyperdense vessel or unexpected calcification. Skull: Unremarkable. Sinuses/Orbits: No acute abnormality. Other: Mastoid air cells are clear. ASPECTS ([place] Stroke Program Early CT Score) - Ganglionic level infarction (caudate, lentiform nuclei, internal capsule, insula, M1-M3 cortex): 7 - Supraganglionic infarction (M4-M6 cortex): 3 Total score (0-10 with 10 being normal): 10 IMPRESSION: There is no acute intracranial hemorrhage or evidence of acute infarction. ASPECT score is 10. These results were called by  telephone at the time of interpretation on 07/23/2021 at 2:26 pm to provider [facility] , who verbally acknowledged these results. Electronically Signed   By: Macy Mis M.D.   On: 07/23/2021 14:31      Assessment:  40 year old female with history of anxiety, depression, migraine headache, TIA versus complicated migraine in [id number] presented to ED for headache, right arm and leg numbness tingling heaviness, and dizziness.  Time onset 12:30 PM.  NIH score 2 for right-sided numbness and mild lethargy.  Initially has right arm drift and pain but with encouragement no drift on further testing.  CT no acute abnormality.  Patient not a tPA candidate given none disabling symptoms and likely not stroke.  Not IR candidate given low NIH score, no LVO signs and likely not stroke.  However, given her young age and presentation, would like to rule out stroke.  Recommend MRI brain stat for further evaluation.  Recommend aspirin 325 in the meantime.  Headache management per EDP.   Recommendations:  Frequent neuro checks Telemetry monitoring MRI brain to rule out stroke Aspirin 325 stat  Headache management per EDP Discussed with Dr. Langston Masker ED physician We remain available for further consultation if changes in history, signs or symptoms indicate the need.   Consult Participants: pt, RN and stroke response RN Location of the provider: [facility] Location of the patient: [facility]  This consult was provided via telemedicine with 2-way video and audio communication. The patient/family was informed that care would be provided in this way and agreed to receive care in this manner.   This patient is receiving care for possible  acute neurological changes. There was 50 minutes of care by this provider at the time of service, including time for direct evaluation via telemedicine, review of medical records, imaging studies and discussion of findings with providers, the patient and/or family.  Rosalin Hawking, MD PhD Stroke  Neurology 07/23/2021 2:42 PM

## 2021-07-23 NOTE — Progress Notes (Signed)
CODE STROKE CT Time 1408 call time 1416 exam started [id number] exam finished 1417 images sent to [facility] exam completed in [facility] called

## 2021-07-23 NOTE — Discharge Instructions (Signed)
Make sure you are getting plenty of rest and eating and drinking well.  Use Tylenol Motrin for ongoing pain.  Call your neurologist for follow-up appointment for further care and treatment for the headache problem.

## 2021-07-23 NOTE — ED Provider Notes (Signed)
[facility] EMERGENCY DEPARTMENT Provider Note   CSN: [id number] Arrival date & time: 07/23/21  1400     History Chief Complaint  Patient presents with   Extremity Weakness    Right arm & leg    Desiree Pope is a 40 y.o. female with history of migraines presented emergency department with headache and right-sided numbness.  She reports onset of symptoms around 12:20 PM.  She reports an intense headache behind her right eye, photophobia, nausea, and then onset of right-sided facial and body numbness.  She says she will often have the symptoms with a migraine, but feels the numbness is worse than normal.  She does report a history of "mini strokes in the past".   She said these episodes generally involved "difficulty speaking."   HPI     Past Medical History:  Diagnosis Date   Anxiety    Anxiety    Depression    Endometriosis    Hx of chlamydia infection    Pericarditis    age 89   Scoliosis     Patient Active Problem List   Diagnosis Date Noted   MDD (major depressive disorder), recurrent episode, severe ([place]) 09/13/2020   S/P laparoscopic assisted vaginal hysterectomy (LAVH) 07/22/2016   Hx of chlamydia infection 04/12/2013    Past Surgical History:  Procedure Laterality Date   ABDOMINAL HYSTERECTOMY     CARDIAC CATHETERIZATION     LAPAROSCOPIC UNILATERAL SALPINGECTOMY Right 07/22/2016   Procedure: LAPAROSCOPIC RIGHT SALPINGECTOMY;  Surgeon: Florian Buff, MD;  Location: [facility];  Service: Gynecology;  Laterality: Right;   LAPAROSCOPIC VAGINAL HYSTERECTOMY WITH SALPINGO OOPHORECTOMY Left 07/22/2016   Procedure: LAPAROSCOPIC ASSISTED VAGINAL HYSTERECTOMY LEFT WITH SALPINGO OOPHORECTOMY;  Surgeon: Florian Buff, MD;  Location: [facility];  Service: Gynecology;  Laterality: Left;   TUBAL LIGATION       OB History     Gravida  2   Para  2   Term  2   Preterm      AB      Living  2      SAB      IAB      Ectopic      Multiple      Live Births  2            Family History  Problem Relation Age of Onset   Hypertension Other    Cancer Other    Diabetes Other    Thyroid disease Other    Hypertension Father    Sleep apnea Father    Hypertension Mother    Sleep apnea Mother    Cancer Mother 1       stomach and breast    Hyperlipidemia Mother    Diabetes Mother    Asthma Sister    Hypertension Sister    Asthma Son    Cancer Paternal Grandmother        bone,stomach   Hyperlipidemia Maternal Grandmother    Hypertension Maternal Grandmother    Thyroid disease Maternal Grandmother    Diabetes Maternal Grandfather     Social History   Tobacco Use   Smoking status: Every Day    Packs/day: 0.25    Years: 18.00    Pack years: 4.50    Types: Cigarettes   Smokeless tobacco: Never  Vaping Use   Vaping Use: Never used  Substance Use Topics   Alcohol use: No   Drug use: No    Home Medications Prior to Admission  medications   Medication Sig Start Date End Date Taking? Authorizing Provider  DULoxetine (CYMBALTA) 60 MG capsule Take 1 capsule (60 mg total) by mouth daily. 09/16/20   Connye Burkitt, NP  gabapentin (NEURONTIN) 400 MG capsule Take 1 capsule (400 mg total) by mouth 3 (three) times daily. 09/16/20   Connye Burkitt, NP  hydrOXYzine (ATARAX/VISTARIL) 50 MG tablet Take 1 tablet (50 mg total) by mouth 3 (three) times daily as needed for anxiety. 09/16/20   Connye Burkitt, NP  OXcarbazepine (TRILEPTAL) 150 MG tablet Take 0.5 tablets (75 mg total) by mouth 2 (two) times daily. 09/16/20   Connye Burkitt, NP  traMADol (ULTRAM) 50 MG tablet Take 50 mg by mouth every 6 (six) hours as needed for moderate pain.  08/20/20   [provider]  FLUoxetine (PROZAC) 20 MG capsule Take 20 mg by mouth. 07/14/19 09/13/20  [provider]    Allergies    Iodine, Shellfish allergy, and Motrin [ibuprofen]  Review of Systems   Review of Systems  Constitutional:  Negative for chills and fever.  HENT:  Negative for ear pain  and sore throat.   Eyes:  Positive for photophobia, pain and visual disturbance.  Respiratory:  Negative for cough and shortness of breath.   Cardiovascular:  Negative for chest pain and palpitations.  Gastrointestinal:  Positive for nausea. Negative for abdominal pain.  Genitourinary:  Negative for dysuria and hematuria.  Musculoskeletal:  Negative for arthralgias and back pain.  Skin:  Negative for color change and rash.  Neurological:  Positive for light-headedness, numbness and headaches. Negative for syncope and speech difficulty.  All other systems reviewed and are negative.  Physical Exam Updated Vital Signs Ht '5\' 4"'$  (1.626 m)   Wt 68 kg   LMP 07/03/2016 Comment: 07/22/16  BMI 25.75 kg/m   Physical Exam Constitutional:      General: She is not in acute distress. HENT:     Head: Normocephalic and atraumatic.  Eyes:     Conjunctiva/sclera: Conjunctivae normal.     Pupils: Pupils are equal, round, and reactive to light.  Cardiovascular:     Rate and Rhythm: Normal rate and regular rhythm.  Pulmonary:     Effort: Pulmonary effort is normal. No respiratory distress.  Abdominal:     General: There is no distension.     Tenderness: There is no abdominal tenderness.  Skin:    General: Skin is warm and dry.  Neurological:     Mental Status: She is alert and oriented to person, place, and time. Mental status is at baseline.     GCS: GCS eye subscore is 4. GCS verbal subscore is 5. GCS motor subscore is 6.     Cranial Nerves: No dysarthria.     Motor: No weakness.     Comments: Reports paresthesia in right half of face, right arm and right leg    ED Results / Procedures / Treatments   Labs (all labs ordered are listed, but only abnormal results are displayed) Labs Reviewed  RESP PANEL BY RT-PCR (FLU A&B, COVID) ARPGX2  ETHANOL  PROTIME-INR  APTT  CBC  DIFFERENTIAL  COMPREHENSIVE METABOLIC PANEL  RAPID URINE DRUG SCREEN, HOSP PERFORMED  URINALYSIS, ROUTINE W REFLEX  MICROSCOPIC  I-STAT CHEM 8, ED  POC URINE PREG, ED    EKG None  Radiology No results found.  Procedures .Critical Care  Date/Time: 07/23/2021 6:08 PM Performed by: Wyvonnia Dusky, MD Authorized by: Wyvonnia Dusky, MD  Critical care provider statement:    Critical care time (minutes):  35   Critical care was necessary to treat or prevent imminent or life-threatening deterioration of the following conditions:  CNS failure or compromise   Critical care was time spent personally by me on the following activities:  Discussions with consultants, evaluation of patient's response to treatment, examination of patient, ordering and performing treatments and interventions, ordering and review of laboratory studies, ordering and review of radiographic studies, pulse oximetry, re-evaluation of patient's condition, obtaining history from patient or surrogate and review of old charts Comments:     Code stroke evaluation - consideration of tPA, consultation with neurologist.  tPA not given to patient   Medications Ordered in ED Medications  metoCLOPramide (REGLAN) injection 10 mg (has no administration in time range)  diphenhydrAMINE (BENADRYL) injection 25 mg (has no administration in time range)  sodium chloride 0.9 % bolus 2,040 mL (has no administration in time range)  prochlorperazine (COMPAZINE) injection 10 mg (has no administration in time range)    ED Course  I have reviewed the triage vital signs and the nursing notes.  Pertinent labs & imaging results that were available during my care of the patient were reviewed by me and considered in my medical decision making (see chart for details).  40 yo female here with headache onset 12:20 pm with right sided numbness  Ddx includes complex migraine (most likely given her prior hx with similar symptoms) vs TIA/stroke vs other  No fever, leukocytosis or nuccal rigidity to suspect PNA Code stroke activated given onset of her  symptoms - CTH negative - in discussion with the neurologist I felt this was less likely an LVO or stroke, more likely a migraine, and decision was made not to give tPA, as the risks outweighed the benefits.  IV migraine medications were given  Patient signed out to Dr Eulis Foster EDP at 3pm pending MRI scan, reassessment of symptoms after migraine medications.  Clinical Course as of 07/23/21 1804  Wed Jul 23, 2021  1432 [facility] negative per radiologist phone report [MT]  28 Neurologist Dr Erlinda Hong recommending treatment for complex migraine, which is most likely diagnosis, but also the patient will require MRI as follow-up for the CT scan.  MR brain ordered. [MT]  [id number] Patient was able to ambulate steadily to the bathroom [MT]    Clinical Course User Index [MT] Kelissa Merlin, Carola Rhine, MD    Final Clinical Impression(s) / ED Diagnoses Final diagnoses:  None    Rx / DC Orders ED Discharge Orders     None        Jenasia Dolinar, Carola Rhine, MD 07/23/21 [phone]

## 2021-07-23 NOTE — ED Notes (Signed)
Patient to CT at this time

## 2021-07-23 NOTE — ED Notes (Signed)
Per Neurologist patient to receive MRI, no TPA to be administered at this time. Patient reports pain in right arm at this time.

## 2021-07-23 NOTE — ED Provider Notes (Signed)
3:15 PM-checkout from Dr. Langston Masker to evaluate patient after treatment, and imaging, and consider disposition.  4:30 PM-MRI return.  I have had communication from Dr. Erlinda Hong, that the MRI is normal.  He recommends follow-up with outpatient neurology.  Patient has previously seen Dr. Merlene Laughter.  Currently patient is lying in bed, conversant and states she still has a mild headache.  Recently she has been having headaches, twice a week.  She has previously seen Dr. Merlene Laughter for nerve problems.  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  We will give her a work release for 3 days.  Advised on things to watch for, and treatments and arrange discharge.  Patient's husband at the bedside during this discussion.  She has no other questions or concerns.   Daleen Bo, MD 07/23/21 [phone]

## 2021-07-23 NOTE — ED Notes (Signed)
Patient to MRI at this time.

## 2021-07-23 NOTE — Plan of Care (Signed)
MRI brain reviewed, negative for stroke. Pt presentation more consistent with complicated migraine. Discussed with Dr. Eulis Foster, from neuro standpoint, if her HA getting better and she is able to ambulate in ED well, she can be discharged from ED from neuro standpoint with close follow up with Dr. Merlene Laughter. Otherwise, she may need overnight observation and PT/OT follow up. Neurology will sign off, please call with further questions. Thank you for the consult.   Desiree Hawking, MD PhD Stroke Neurology 07/23/2021 4:15 PM

## 2022-01-02 ENCOUNTER — Emergency Department (HOSPITAL_COMMUNITY)
Admission: EM | Admit: 2022-01-02 | Discharge: 2022-01-02 | Disposition: A | Discharge status: Home / Self Care | Payer: Medicaid Other | Attending: Emergency Medicine | Admitting: Emergency Medicine

## 2022-01-02 ENCOUNTER — Other Ambulatory Visit: Payer: Self-pay

## 2022-01-02 ENCOUNTER — Encounter (HOSPITAL_COMMUNITY): Payer: Self-pay

## 2022-01-02 DIAGNOSIS — Z79899 Other long term (current) drug therapy: Secondary | ICD-10-CM | POA: Insufficient documentation

## 2022-01-02 DIAGNOSIS — R059 Cough, unspecified: Secondary | ICD-10-CM | POA: Diagnosis present

## 2022-01-02 DIAGNOSIS — U071 COVID-19: Secondary | ICD-10-CM | POA: Insufficient documentation

## 2022-01-02 HISTORY — DX: Personal history of peptic ulcer disease: Z87.11

## 2022-01-02 LAB — RESP PANEL BY RT-PCR (FLU A&B, COVID) ARPGX2
Influenza A by PCR: NEGATIVE
Influenza B by PCR: NEGATIVE
SARS Coronavirus 2 by RT PCR: POSITIVE — AB

## 2022-01-02 MED ORDER — NIRMATRELVIR/RITONAVIR (PAXLOVID)TABLET
3.0000 | ORAL_TABLET | Freq: Two times a day (BID) | ORAL | Status: DC
  Administered 2022-01-02: 3 via ORAL
  Filled 2022-01-02: qty 30

## 2022-01-02 MED ORDER — HYDROCODONE-ACETAMINOPHEN 5-325 MG PO TABS
1.0000 | ORAL_TABLET | Freq: Once | ORAL | Status: AC
  Administered 2022-01-02: 1 via ORAL
  Filled 2022-01-02: qty 1

## 2022-01-02 MED ORDER — ONDANSETRON 4 MG PO TBDP
4.0000 mg | ORAL_TABLET | Freq: Once | ORAL | Status: AC
  Administered 2022-01-02: 4 mg via ORAL
  Filled 2022-01-02: qty 1

## 2022-01-02 NOTE — ED Provider Notes (Signed)
[facility] EMERGENCY DEPARTMENT Provider Note   CSN: [id number] Arrival date & time: 01/02/22  1831     History  Chief Complaint  Patient presents with   flu like symptoms    Desiree Pope is a 41 y.o. female.  Patient with myalgias cough and congestion today, patient has no medical problems.  The history is provided by the patient and medical records. No language interpreter was used.  Cough Cough characteristics:  Non-productive Sputum characteristics:  Nondescript Severity:  Moderate Onset quality:  Sudden Timing:  Constant Progression:  Worsening Chronicity:  New Smoker: no   Context: not animal exposure   Relieved by:  Nothing Worsened by:  Nothing Associated symptoms: no chest pain, no eye discharge, no headaches and no rash       Home Medications Prior to Admission medications   Medication Sig Start Date End Date Taking? Authorizing Provider  fexofenadine (ALLEGRA) 180 MG tablet Take 180 mg by mouth daily.   Yes [provider]  hydrOXYzine (ATARAX/VISTARIL) 50 MG tablet Take 1 tablet (50 mg total) by mouth 3 (three) times daily as needed for anxiety. 09/16/20  Yes Connye Burkitt, NP  DULoxetine (CYMBALTA) 60 MG capsule Take 1 capsule (60 mg total) by mouth daily. Patient not taking: Reported on 01/02/2022 09/16/20   Connye Burkitt, NP  gabapentin (NEURONTIN) 400 MG capsule Take 1 capsule (400 mg total) by mouth 3 (three) times daily. Patient not taking: Reported on 01/02/2022 09/16/20   Connye Burkitt, NP  OXcarbazepine (TRILEPTAL) 150 MG tablet Take 0.5 tablets (75 mg total) by mouth 2 (two) times daily. Patient not taking: Reported on 01/02/2022 09/16/20   Connye Burkitt, NP  FLUoxetine (PROZAC) 20 MG capsule Take 20 mg by mouth. 07/14/19 09/13/20  [provider]      Allergies    Iodine, Shellfish allergy, and Motrin [ibuprofen]    Review of Systems   Review of Systems  Constitutional:  Negative for appetite change and fatigue.  HENT:   Negative for congestion, ear discharge and sinus pressure.   Eyes:  Negative for discharge.  Respiratory:  Positive for cough.   Cardiovascular:  Negative for chest pain.  Gastrointestinal:  Negative for abdominal pain and diarrhea.  Genitourinary:  Negative for frequency and hematuria.  Musculoskeletal:  Negative for back pain.  Skin:  Negative for rash.  Neurological:  Negative for seizures and headaches.  Psychiatric/Behavioral:  Negative for hallucinations.    Physical Exam Updated Vital Signs BP 109/86    Pulse 89    Temp 98 F (36.7 C) (Oral)    Resp 17    Ht 5\' 4"  (1.626 m)    Wt 68 kg    LMP 07/03/2016 Comment: 07/22/16   SpO2 98%    BMI 25.75 kg/m  Physical Exam Vitals and nursing note reviewed.  Constitutional:      Appearance: She is well-developed.  HENT:     Head: Normocephalic.     Nose: Nose normal.  Eyes:     General: No scleral icterus.    Conjunctiva/sclera: Conjunctivae normal.  Neck:     Thyroid: No thyromegaly.  Cardiovascular:     Rate and Rhythm: Normal rate and regular rhythm.     Heart sounds: No murmur heard.   No friction rub. No gallop.  Pulmonary:     Breath sounds: No stridor. No wheezing or rales.  Chest:     Chest wall: No tenderness.  Abdominal:     General:  There is no distension.     Tenderness: There is no abdominal tenderness. There is no rebound.  Musculoskeletal:        General: Normal range of motion.     Cervical back: Neck supple.  Lymphadenopathy:     Cervical: No cervical adenopathy.  Skin:    Findings: No erythema or rash.  Neurological:     Mental Status: She is alert and oriented to person, place, and time.     Motor: No abnormal muscle tone.     Coordination: Coordination normal.  Psychiatric:        Behavior: Behavior normal.    ED Results / Procedures / Treatments   Labs (all labs ordered are listed, but only abnormal results are displayed) Labs Reviewed  RESP PANEL BY RT-PCR (FLU A&B, COVID) [id number] - Abnormal;  Notable for the following components:      Result Value   SARS Coronavirus 2 by RT PCR POSITIVE (*)    All other components within normal limits    EKG None  Radiology No results found.  Procedures Procedures    Medications Ordered in ED Medications  nirmatrelvir/ritonavir EUA (PAXLOVID) 3 tablet (has no administration in time range)  ondansetron (ZOFRAN-ODT) disintegrating tablet 4 mg (4 mg Oral Given 01/02/22 1945)  HYDROcodone-acetaminophen (NORCO/VICODIN) 5-325 MG per tablet 1 tablet (1 tablet Oral Given 01/02/22 2158)    ED Course/ Medical Decision Making/ A&P                           Medical Decision Making  Patient with COVID-19.  She is give in paxlovid and will stay out of work 5 more days This patient presents to the ED for concern of cough mild, this involves an extensive number of treatment options, and is a complaint that carries with it a high risk of complications and morbidity.  The differential diagnosis includes viral syndrome, pneumonia   Co morbidities that complicate the patient evaluation  None   Additional history obtained:  Additional history obtained from patient and relative External records from outside source obtained and reviewed including old hospital records reviewed   Lab Tests:  I Ordered, and personally interpreted labs.  The pertinent results include: COVID which was positive   Imaging Studies ordered:  No imaging  Cardiac Monitoring:  The patient was maintained on a cardiac monitor.  I personally viewed and interpreted the cardiac monitored which showed an underlying rhythm of: Normal sinus rhythm   Medicines ordered and prescription drug management:  I ordered medication including Zofran for nausea and hydrocodone for pain Reevaluation of the patient after these medicines showed that the patient improved I have reviewed the patients home medicines and have made adjustments as needed   Test Considered:  Chest  x-ray   Critical Interventions:  paxlovid   Consultations Obtained:  No consult  Problem List / ED Course:  COVID-19   Reevaluation:  After the interventions noted above, I reevaluated the patient and found that they have :improved   Social Determinants of Health:  None   Dispostion:  After consideration of the diagnostic results and the patients response to treatment, I feel that the patent would benefit from discharged home and told to take Tylenol for aches and pain and take the paxlovid.         Final Clinical Impression(s) / ED Diagnoses Final diagnoses:  [id number]    Rx / DC Orders ED Discharge Orders  None         Milton Ferguson, MD 01/04/22 1118

## 2022-01-02 NOTE — ED Triage Notes (Signed)
Pt from home with c/o flu like symptoms: cough, body aches, fever, headache- started today. Pt took Tylenol 1000 mg 1 hour ago.

## 2022-01-02 NOTE — Discharge Instructions (Signed)
Drink plenty of fluids and take Tylenol for pain follow-up if not improving

## 2022-02-12 ENCOUNTER — Ambulatory Visit: Payer: Medicaid Other | Admitting: Obstetrics & Gynecology

## 2022-02-12 ENCOUNTER — Other Ambulatory Visit: Payer: Self-pay

## 2022-02-12 ENCOUNTER — Encounter: Payer: Self-pay | Admitting: Obstetrics & Gynecology

## 2022-02-12 VITALS — BP 116/76 | HR 80 | Ht 64.0 in | Wt 142.0 lb

## 2022-02-12 DIAGNOSIS — Z8742 Personal history of other diseases of the female genital tract: Secondary | ICD-10-CM | POA: Diagnosis not present

## 2022-02-12 DIAGNOSIS — N941 Unspecified dyspareunia: Secondary | ICD-10-CM | POA: Diagnosis not present

## 2022-02-12 DIAGNOSIS — R102 Pelvic and perineal pain: Secondary | ICD-10-CM

## 2022-02-12 MED ORDER — PROGESTERONE 200 MG PO CAPS: 200.0000 mg | ORAL_CAPSULE | Freq: Every day | ORAL | 11 refills | Status: AC

## 2022-02-12 NOTE — Progress Notes (Signed)
Chief Complaint  Patient presents with   Pelvic Pain      41 y.o. [id number] Patient's last menstrual period was 07/03/2016. The current method of family planning is status post hysterectomy.  Outpatient Encounter Medications as of 02/12/2022  Medication Sig   progesterone (PROMETRIUM) 200 MG capsule Take 1 capsule (200 mg total) by mouth daily.   [DISCONTINUED] DULoxetine (CYMBALTA) 60 MG capsule Take 1 capsule (60 mg total) by mouth daily. (Patient not taking: Reported on 01/02/2022)   [DISCONTINUED] fexofenadine (ALLEGRA) 180 MG tablet Take 180 mg by mouth daily.   [DISCONTINUED] FLUoxetine (PROZAC) 20 MG capsule Take 20 mg by mouth.   [DISCONTINUED] gabapentin (NEURONTIN) 400 MG capsule Take 1 capsule (400 mg total) by mouth 3 (three) times daily. (Patient not taking: Reported on 01/02/2022)   [DISCONTINUED] hydrOXYzine (ATARAX/VISTARIL) 50 MG tablet Take 1 tablet (50 mg total) by mouth 3 (three) times daily as needed for anxiety.   [DISCONTINUED] OXcarbazepine (TRILEPTAL) 150 MG tablet Take 0.5 tablets (75 mg total) by mouth 2 (two) times daily. (Patient not taking: Reported on 01/02/2022)   No facility-administered encounter medications on file as of 02/12/2022.    Subjective Pt has about 6 months of increasing pelvic pain Midline to right side only Crampy sometimes sharp Also has increasing problems with right-sided dyspareunia She does use lubrication but she has no insertional pain The pain is deep on the right side and they have to stop about 90% of the time Bowel movements are normal No urinary complaints No blood in urine or stool  Past Medical History:  Diagnosis Date   Anxiety    Anxiety    Depression    Endometriosis    History of stomach ulcers    Hx of chlamydia infection    Pericarditis    age 71   Scoliosis     Past Surgical History:  Procedure Laterality Date   ABDOMINAL HYSTERECTOMY     CARDIAC CATHETERIZATION     LAPAROSCOPIC UNILATERAL  SALPINGECTOMY Right 07/22/2016   Procedure: LAPAROSCOPIC RIGHT SALPINGECTOMY;  Surgeon: Florian Buff, MD;  Location: [facility];  Service: Gynecology;  Laterality: Right;   LAPAROSCOPIC VAGINAL HYSTERECTOMY WITH SALPINGO OOPHORECTOMY Left 07/22/2016   Procedure: LAPAROSCOPIC ASSISTED VAGINAL HYSTERECTOMY LEFT WITH SALPINGO OOPHORECTOMY;  Surgeon: Florian Buff, MD;  Location: [facility];  Service: Gynecology;  Laterality: Left;   TUBAL LIGATION      OB History     Gravida  2   Para  2   Term  2   Preterm      AB      Living  2      SAB      IAB      Ectopic      Multiple      Live Births  2           Allergies  Allergen Reactions   Iodine Anaphylaxis   Shellfish Allergy Anaphylaxis    Stop breathing   Motrin [Ibuprofen] Other (See Comments)    "Stomach ulcers"    Social History   Socioeconomic History   Marital status: Legally Separated    Spouse name: Not on file   Number of children: Not on file   Years of education: Not on file   Highest education level: Not on file  Occupational History   Not on file  Tobacco Use   Smoking status: Every Day    Packs/day: 0.25    Years: 18.00  Pack years: 4.50    Types: Cigarettes   Smokeless tobacco: Never  Vaping Use   Vaping Use: Some days  Substance and Sexual Activity   Alcohol use: No   Drug use: No   Sexual activity: Yes    Birth control/protection: Surgical    Comment: hysterectomy  Other Topics Concern   Not on file  Social History Narrative   Not on file   Social Determinants of Health   Financial Resource Strain: Low Risk    Difficulty of Paying Living Expenses: Not hard at all  Food Insecurity: No Food Insecurity   Worried About Charity fundraiser in the Last Year: Never true   [place] in the Last Year: Never true  Transportation Needs: No Transportation Needs   Lack of Transportation (Medical): No   Lack of Transportation (Non-Medical): No  Physical Activity: Inactive   Days of  Exercise per Week: 0 days   Minutes of Exercise per Session: 0 min  Stress: Stress Concern Present   Feeling of Stress : Very much  Social Connections: Socially Isolated   Frequency of Communication with Friends and Family: Once a week   Frequency of Social Gatherings with Friends and Family: Once a week   Attends Religious Services: Never   Marine scientist or Organizations: No   Attends Music therapist: Never   Marital Status: Separated    Family History  Problem Relation Age of Onset   Hypertension Other    Cancer Other    Diabetes Other    Thyroid disease Other    Hypertension Father    Sleep apnea Father    Hypertension Mother    Sleep apnea Mother    Cancer Mother 61       stomach and breast    Hyperlipidemia Mother    Diabetes Mother    Asthma Sister    Hypertension Sister    Asthma Son    Cancer Paternal Grandmother        bone,stomach   Hyperlipidemia Maternal Grandmother    Hypertension Maternal Grandmother    Thyroid disease Maternal Grandmother    Diabetes Maternal Grandfather     Medications:       Current Outpatient Medications:    progesterone (PROMETRIUM) 200 MG capsule, Take 1 capsule (200 mg total) by mouth daily., Disp: 30 capsule, Rfl: 11  Objective Blood pressure 116/76, pulse 80, height 5\' 4"  (1.626 m), weight 142 lb (64.4 kg), last menstrual period 07/03/2016.  General WDWN female NAD Vulva:  normal appearing vulva with no masses, tenderness or lesions Vagina:  normal mucosa, no discharge, cuff intact non tender Cervix:  absent Uterus: absent Adnexa: right ovary present,  normal adnexa in size, nontender and no masses, mobile   Pertinent ROS No burning with urination, frequency or urgency No nausea, vomiting or diarrhea Nor fever chills or other constitutional symptoms   Labs or studies No new    Impression Diagnoses this Encounter::   ICD-10-CM   1. Pelvic pain, right sided  R10.2 US PELVIS (TRANSABDOMINAL  ONLY)    US PELVIS TRANSVAGINAL NON-OB (TV ONLY)    2. History of endometriosis  Z87.42 US PELVIS (TRANSABDOMINAL ONLY)    US PELVIS TRANSVAGINAL NON-OB (TV ONLY)   S/P LAVH BSO RS 06/2016     3. Dyspareunia, female  N94.10 US PELVIS (TRANSABDOMINAL ONLY)    US PELVIS TRANSVAGINAL NON-OB (TV ONLY)      Established relevant diagnosis(es):   Plan/Recommendations:  Meds ordered this encounter  Medications   progesterone (PROMETRIUM) 200 MG capsule    Sig: Take 1 capsule (200 mg total) by mouth daily.    Dispense:  30 capsule    Refill:  11    Labs or Scans Ordered: Orders Placed This Encounter  Procedures   US PELVIS (TRANSABDOMINAL ONLY)   US PELVIS TRANSVAGINAL NON-OB (TV ONLY)    Management:: Will trial of Prometrium as a progesterone only approach Her pain may represent endometriosis, adhesions are both She did have an endometrioma on the left ovary at the time of surgery in 2017 But there is no endometrioma of the right ovary palpated today We will do a 27-month trial of Prometrium followed by a sonogram to evaluate the right ovary and to see how her symptoms have done If she still having problems I will consider nexstetllis and see if that is helpful with her pain syndrome  Ultimately, as she was made aware in 2017, it is likely that she will undergo a right oophorectomy at some point We will try to exhaust conservative measures and delay that if possible because of her age She agrees with that approach Follow up Return in about 3 months (around 05/12/2022) for GYN sono, Follow up, with Dr Elonda Husky.       All questions were answered.

## 2022-02-23 ENCOUNTER — Encounter (HOSPITAL_COMMUNITY): Payer: Self-pay

## 2022-02-23 ENCOUNTER — Emergency Department (HOSPITAL_COMMUNITY): Payer: Medicaid Other

## 2022-02-23 ENCOUNTER — Other Ambulatory Visit: Payer: Self-pay

## 2022-02-23 ENCOUNTER — Emergency Department (HOSPITAL_COMMUNITY)
Admission: EM | Admit: 2022-02-23 | Discharge: 2022-02-23 | Disposition: A | Discharge status: Home / Self Care | Payer: Medicaid Other | Attending: Emergency Medicine | Admitting: Emergency Medicine

## 2022-02-23 DIAGNOSIS — R1031 Right lower quadrant pain: Secondary | ICD-10-CM | POA: Insufficient documentation

## 2022-02-23 DIAGNOSIS — R102 Pelvic and perineal pain: Secondary | ICD-10-CM | POA: Diagnosis not present

## 2022-02-23 LAB — CBC WITH DIFFERENTIAL/PLATELET
Abs Immature Granulocytes: 0.01 10*3/uL (ref 0.00–0.07)
Basophils Absolute: 0 10*3/uL (ref 0.0–0.1)
Basophils Relative: 1 %
Eosinophils Absolute: 0.2 10*3/uL (ref 0.0–0.5)
Eosinophils Relative: 4 %
HCT: 39.3 % (ref 36.0–46.0)
Hemoglobin: 12.5 g/dL (ref 12.0–15.0)
Immature Granulocytes: 0 %
Lymphocytes Relative: 37 %
Lymphs Abs: 2.5 10*3/uL (ref 0.7–4.0)
MCH: 28.5 pg (ref 26.0–34.0)
MCHC: 31.8 g/dL (ref 30.0–36.0)
MCV: 89.7 fL (ref 80.0–100.0)
Monocytes Absolute: 0.5 10*3/uL (ref 0.1–1.0)
Monocytes Relative: 7 %
Neutro Abs: 3.5 10*3/uL (ref 1.7–7.7)
Neutrophils Relative %: 51 %
Platelets: 302 10*3/uL (ref 150–400)
RBC: 4.38 MIL/uL (ref 3.87–5.11)
RDW: 12.8 % (ref 11.5–15.5)
WBC: 6.7 10*3/uL (ref 4.0–10.5)
nRBC: 0 % (ref 0.0–0.2)

## 2022-02-23 LAB — URINALYSIS, ROUTINE W REFLEX MICROSCOPIC
Bilirubin Urine: NEGATIVE
Glucose, UA: NEGATIVE mg/dL
Ketones, ur: NEGATIVE mg/dL
Nitrite: NEGATIVE
Protein, ur: NEGATIVE mg/dL
Specific Gravity, Urine: 1.016 (ref 1.005–1.030)
pH: 5 (ref 5.0–8.0)

## 2022-02-23 LAB — COMPREHENSIVE METABOLIC PANEL
ALT: 16 U/L (ref 0–44)
AST: 18 U/L (ref 15–41)
Albumin: 3.7 g/dL (ref 3.5–5.0)
Alkaline Phosphatase: 75 U/L (ref 38–126)
Anion gap: 8 (ref 5–15)
BUN: 13 mg/dL (ref 6–20)
CO2: 24 mmol/L (ref 22–32)
Calcium: 8.9 mg/dL (ref 8.9–10.3)
Chloride: 106 mmol/L (ref 98–111)
Creatinine, Ser: 0.86 mg/dL (ref 0.44–1.00)
GFR, Estimated: >60 mL/min (ref >60)
Glucose, Bld: 95 mg/dL (ref 70–99)
Potassium: 3.5 mmol/L (ref 3.5–5.1)
Sodium: 138 mmol/L (ref 135–145)
Total Bilirubin: 0.1 mg/dL — ABNORMAL LOW (ref 0.3–1.2)
Total Protein: 6.8 g/dL (ref 6.5–8.1)

## 2022-02-23 MED ORDER — SODIUM CHLORIDE 0.9 % IV BOLUS
1000.0000 mL | Freq: Once | INTRAVENOUS | Status: AC
  Administered 2022-02-23: 1000 mL via INTRAVENOUS

## 2022-02-23 MED ORDER — HYDROMORPHONE HCL 1 MG/ML IJ SOLN
1.0000 mg | Freq: Once | INTRAMUSCULAR | Status: AC
  Administered 2022-02-23: 1 mg via INTRAVENOUS
  Filled 2022-02-23: qty 1

## 2022-02-23 MED ORDER — ONDANSETRON HCL 4 MG/2ML IJ SOLN
4.0000 mg | Freq: Once | INTRAMUSCULAR | Status: AC
  Administered 2022-02-23: 4 mg via INTRAVENOUS
  Filled 2022-02-23: qty 2

## 2022-02-23 MED ORDER — DICYCLOMINE HCL 20 MG PO TABS: 20.0000 mg | ORAL_TABLET | Freq: Three times a day (TID) | ORAL | 0 refills | Status: AC

## 2022-02-23 MED ORDER — IOHEXOL 300 MG/ML  SOLN
100.0000 mL | Freq: Once | INTRAMUSCULAR | Status: AC | PRN
  Administered 2022-02-23: 100 mL via INTRAVENOUS

## 2022-02-23 MED ORDER — OXYCODONE-ACETAMINOPHEN 5-325 MG PO TABS: 1.0000 | ORAL_TABLET | Freq: Four times a day (QID) | ORAL | 0 refills | Status: AC | PRN

## 2022-02-23 NOTE — ED Provider Notes (Signed)
[facility] EMERGENCY DEPARTMENT Provider Note   CSN: [id number] Arrival date & time: 02/23/22  0005     History  Chief Complaint  Patient presents with   Abdominal Pain    Desiree Pope is a 41 y.o. female.  Patient presents to the emergency department for evaluation of right lower abdominal and pelvic pain.  Symptoms ongoing for some time.  She was seen by her OB/GYN doctor 2 weeks ago for this pain and it was presumed that her pain was secondary to her endometriosis and/or scar tissue.  She has had previous hysterectomy and left oophorectomy secondary to endometriosis.  Patient was started on progesterone and scheduled for ultrasound in May.  She reports that she has been taking Tylenol but the pain is not controlled.      Home Medications Prior to Admission medications   Medication Sig Start Date End Date Taking? Authorizing Provider  progesterone (PROMETRIUM) 200 MG capsule Take 1 capsule (200 mg total) by mouth daily. 02/12/22   Florian Buff, MD  FLUoxetine (PROZAC) 20 MG capsule Take 20 mg by mouth. 07/14/19 09/13/20  [provider]      Allergies    Iodine, Shellfish allergy, and Motrin [ibuprofen]    Review of Systems   Review of Systems  Gastrointestinal:  Positive for abdominal pain.  Genitourinary:  Positive for pelvic pain.   Physical Exam Updated Vital Signs BP 119/78    Pulse 75    Temp 98.3 F (36.8 C) (Oral)    Resp 17    Ht 5\' 4"  (1.626 m)    Wt 65.8 kg    LMP 07/03/2016 Comment: 07/22/16   SpO2 99%    BMI 24.89 kg/m  Physical Exam Vitals and nursing note reviewed.  Constitutional:      General: She is not in acute distress.    Appearance: She is well-developed.  HENT:     Head: Normocephalic and atraumatic.     Mouth/Throat:     Mouth: Mucous membranes are moist.  Eyes:     General: Vision grossly intact. Gaze aligned appropriately.     Extraocular Movements: Extraocular movements intact.     Conjunctiva/sclera: Conjunctivae normal.   Cardiovascular:     Rate and Rhythm: Normal rate and regular rhythm.     Pulses: Normal pulses.     Heart sounds: Normal heart sounds, S1 normal and S2 normal. No murmur heard.   No friction rub. No gallop.  Pulmonary:     Effort: Pulmonary effort is normal. No respiratory distress.     Breath sounds: Normal breath sounds.  Abdominal:     General: Bowel sounds are normal.     Palpations: Abdomen is soft.     Tenderness: There is abdominal tenderness in the right lower quadrant. There is no guarding or rebound.     Hernia: No hernia is present.  Musculoskeletal:        General: No swelling.     Cervical back: Full passive range of motion without pain, normal range of motion and neck supple. No spinous process tenderness or muscular tenderness. Normal range of motion.     Right lower leg: No edema.     Left lower leg: No edema.  Skin:    General: Skin is warm and dry.     Capillary Refill: Capillary refill takes less than 2 seconds.     Findings: No ecchymosis, erythema, rash or wound.  Neurological:     General: No focal deficit present.  Mental Status: She is alert and oriented to person, place, and time.     GCS: GCS eye subscore is 4. GCS verbal subscore is 5. GCS motor subscore is 6.     Cranial Nerves: Cranial nerves 2-12 are intact.     Sensory: Sensation is intact.     Motor: Motor function is intact.     Coordination: Coordination is intact.  Psychiatric:        Attention and Perception: Attention normal.        Mood and Affect: Mood normal.        Speech: Speech normal.        Behavior: Behavior normal.    ED Results / Procedures / Treatments   Labs (all labs ordered are listed, but only abnormal results are displayed) Labs Reviewed  COMPREHENSIVE METABOLIC PANEL - Abnormal; Notable for the following components:      Result Value   Total Bilirubin 0.1 (*)    All other components within normal limits  URINALYSIS, ROUTINE W REFLEX MICROSCOPIC - Abnormal;  Notable for the following components:   Hgb urine dipstick SMALL (*)    Leukocytes,Ua TRACE (*)    Bacteria, UA RARE (*)    All other components within normal limits  CBC WITH DIFFERENTIAL/PLATELET    EKG None  Radiology CT ABDOMEN PELVIS W CONTRAST  Result Date: 02/23/2022 CLINICAL DATA:  Right lower quadrant abdominal pain EXAM: CT ABDOMEN AND PELVIS WITH CONTRAST TECHNIQUE: Multidetector CT imaging of the abdomen and pelvis was performed using the standard protocol following bolus administration of intravenous contrast. RADIATION DOSE REDUCTION: This exam was performed according to the departmental dose-optimization program which includes automated exposure control, adjustment of the mA and/or kV according to patient size and/or use of iterative reconstruction technique. CONTRAST:  147mL OMNIPAQUE IOHEXOL 300 MG/ML  SOLN COMPARISON:  None. FINDINGS: Lower chest: No acute abnormality. Hepatobiliary: No focal liver abnormality is seen. No gallstones, gallbladder wall thickening, or biliary dilatation. Pancreas: Unremarkable Spleen: Unremarkable Adrenals/Urinary Tract: Adrenal glands are unremarkable. Kidneys are normal, without renal calculi, focal lesion, or hydronephrosis. Bladder is unremarkable. Stomach/Bowel: Stomach is within normal limits. Appendix appears normal. No evidence of bowel wall thickening, distention, or inflammatory changes. Vascular/Lymphatic: No significant vascular findings are present. No enlarged abdominal or pelvic lymph nodes. Reproductive: Status post hysterectomy. No adnexal masses. Residual right ovary is unremarkable. Other: No abdominal wall hernia. Musculoskeletal: No acute bone abnormality. No lytic or blastic bone lesion. IMPRESSION: No acute intra-abdominal pathology identified. No definite radiographic explanation for the patient's reported symptoms. Normal appendix. Electronically Signed   By: Fidela Salisbury M.D.   On: 02/23/2022 02:35    Procedures Procedures     Medications Ordered in ED Medications  sodium chloride 0.9 % bolus 1,000 mL (0 mLs Intravenous Stopped 02/23/22 0246)  HYDROmorphone (DILAUDID) injection 1 mg (1 mg Intravenous Given 02/23/22 0106)  ondansetron (ZOFRAN) injection 4 mg (4 mg Intravenous Given 02/23/22 0106)  iohexol (OMNIPAQUE) 300 MG/ML solution 100 mL (100 mLs Intravenous Contrast Given 02/23/22 0221)    ED Course/ Medical Decision Making/ A&P                           Medical Decision Making Amount and/or Complexity of Data Reviewed Labs: ordered. Radiology: ordered.  Risk Prescription drug management.   Patient presents to the emergency department for evaluation of abdominal pain/pelvic pain.  Patient has a history of endometriosis.  She has been having pain  for more than 6 months in the right lower abdomen and pelvis.  She was seen by OB/GYN 2 weeks ago and started on progesterone therapy.  Patient with continued pain today.  Lab work was reassuring.  Vital signs are normal.  No fever.  She does not have signs of peritonitis on exam.  CT scan does not show any acute pathology.  Doubt large endometrioma or other ovarian process.  Pelvic exam by OB/GYN recently was normal as well.  Etiology of the pain is unclear but there is not an acute surgical process or require for further ER work-up at this time.  Patient provided analgesia and will refer back to OB/GYN.  Trial of Bentyl in the event that this is GI in nature.        Final Clinical Impression(s) / ED Diagnoses Final diagnoses:  Pelvic pain in female    Rx / DC Orders ED Discharge Orders     None         Archit Leger, Gwenyth Allegra, MD 02/23/22 [phone]

## 2022-02-23 NOTE — ED Triage Notes (Addendum)
Pov from home cc of abdominal pain and back pain. [facility] and [facility] dr Elonda Husky. Said shes not feeling any better. Hx of endometriosis

## 2022-02-23 NOTE — ED Notes (Signed)
Edp in room  

## 2022-02-24 MED FILL — Oxycodone w/ Acetaminophen Tab 5-325 MG: ORAL | Qty: 6 | Status: AC

## 2022-05-12 ENCOUNTER — Other Ambulatory Visit: Payer: Medicaid Other

## 2022-05-12 ENCOUNTER — Ambulatory Visit: Payer: Medicaid Other | Admitting: Obstetrics & Gynecology

## 2022-05-13 ENCOUNTER — Other Ambulatory Visit: Payer: Medicaid Other

## 2022-05-13 ENCOUNTER — Ambulatory Visit: Payer: Medicaid Other | Admitting: Obstetrics & Gynecology

## 2023-02-28 IMAGING — CT CT HEAD CODE STROKE
3 series · 16 of 47 positions shown, 19 images · non-contrast
Comparison: Correlation made with MRI brain 05/30/2020

CLINICAL DATA: No deficit, acute, stroke suspected; right-sided
numbness

EXAM:
CT HEAD WITHOUT CONTRAST
TECHNIQUE: Contiguous axial images were obtained from the base of the skull
through the vertex without intravenous contrast.

[Series 2: head w o · axial · 0.42mm/px · z∈[-4,+131]mm · 10 of 33 slices shown, 13 images]
[im 3/33  brain]
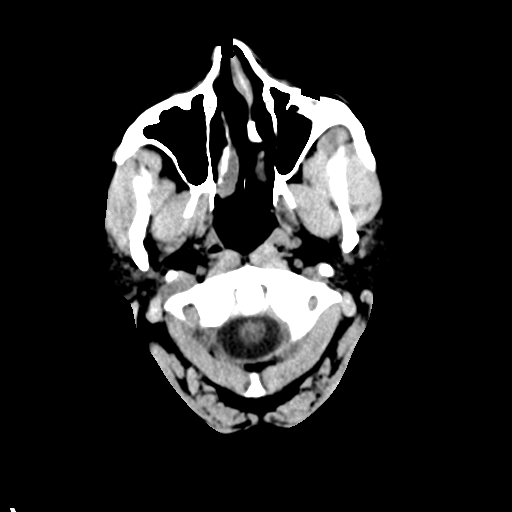
[im 3/33  bone]
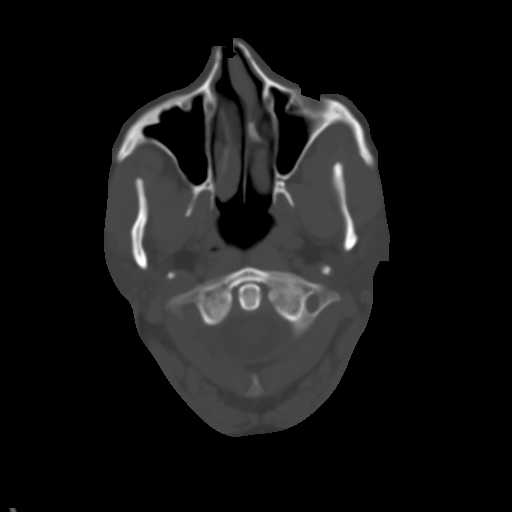
[im 6/33  brain]
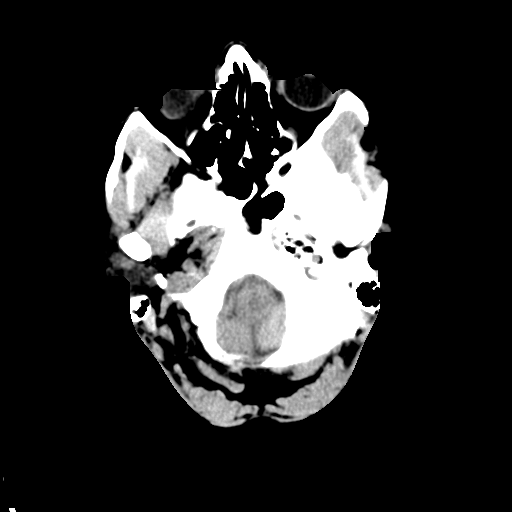
[im 9/33  brain]
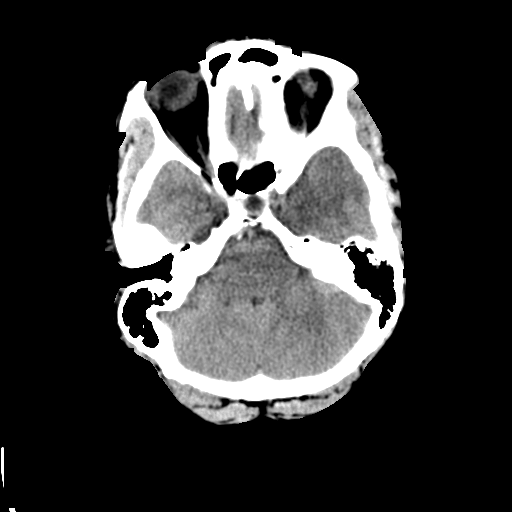
[im 12/33  brain]
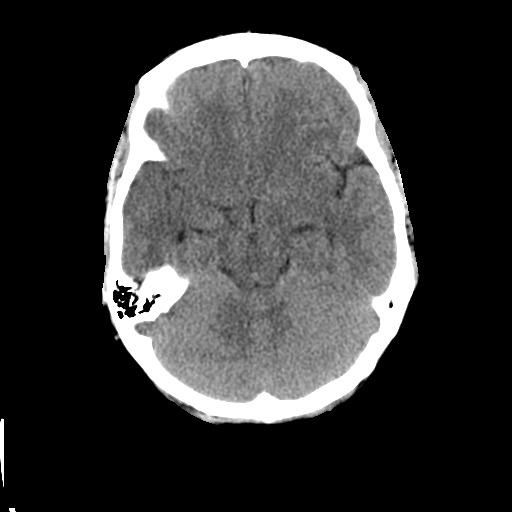
[im 15/33  brain]
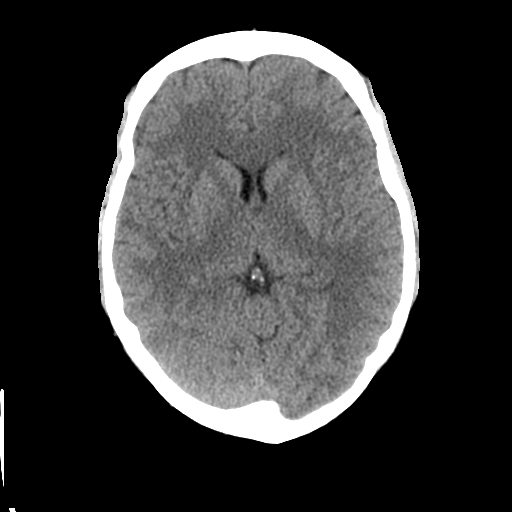
[im 15/33  bone]
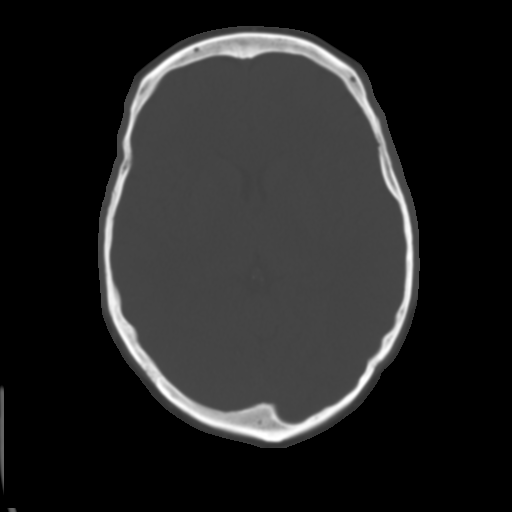
[im 18/33  brain]
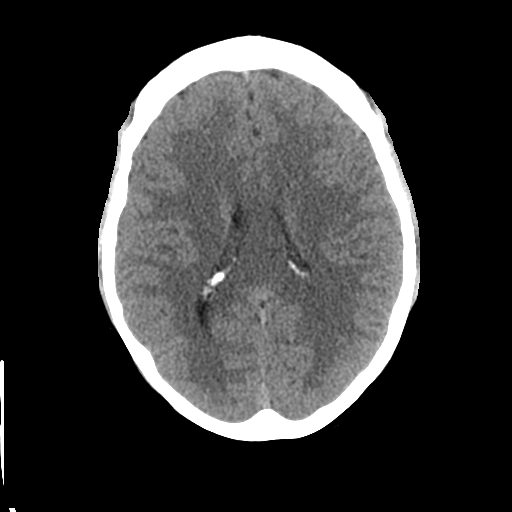
[im 21/33  brain]
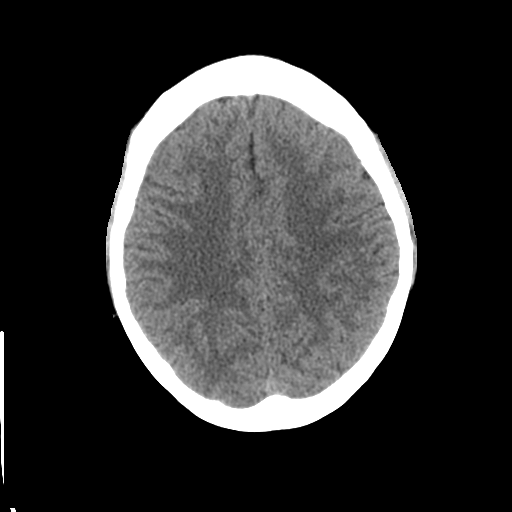
[im 25/33  brain]
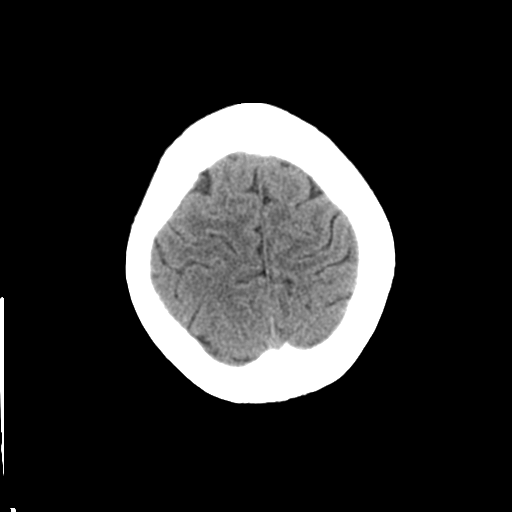
[im 27/33  brain]
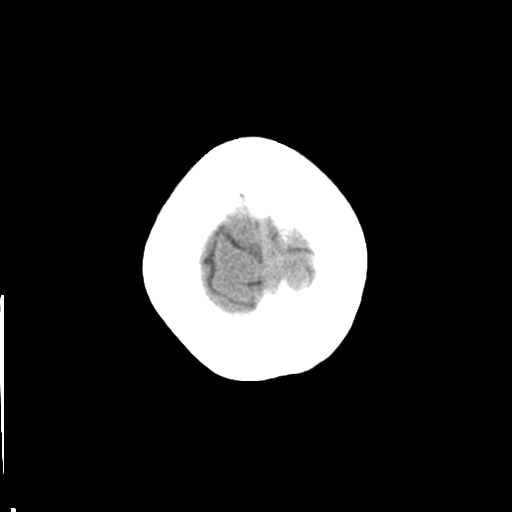
[im 27/33  bone]
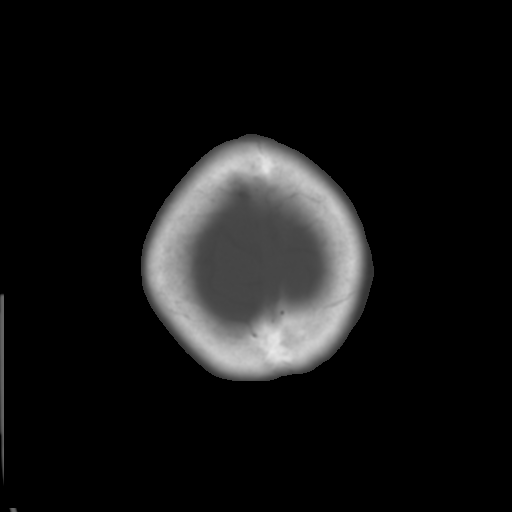
[im 30/33  brain]
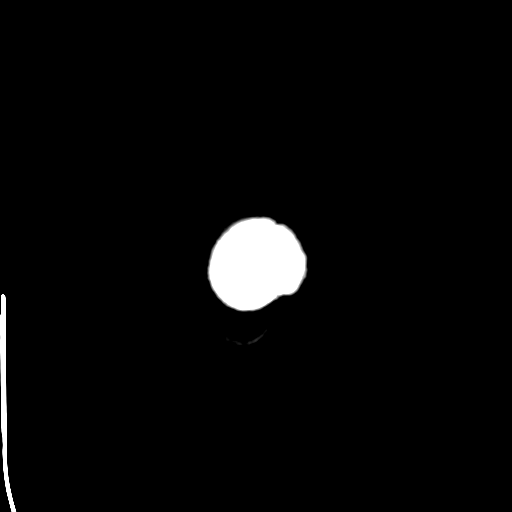

[Series 4: coronal soft · coronal · 0.33mm/px · 3 of 66 slices shown]
[im 22/66  brain]
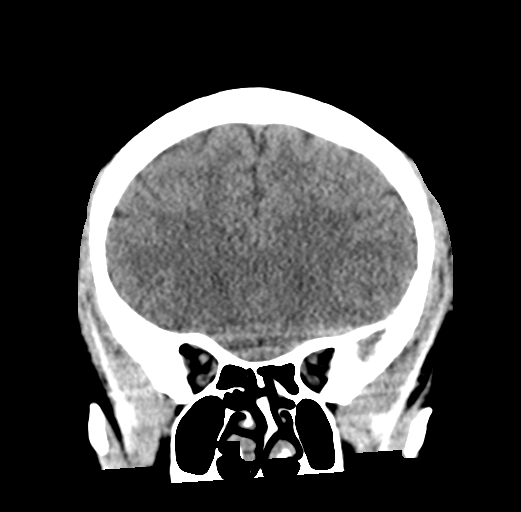
[im 29/66  brain]
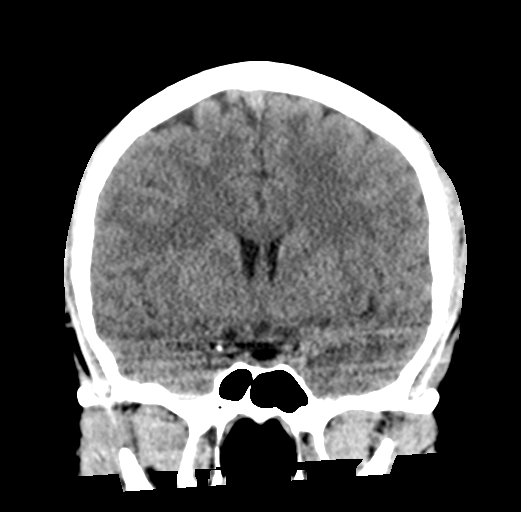
[im 37/66  brain]
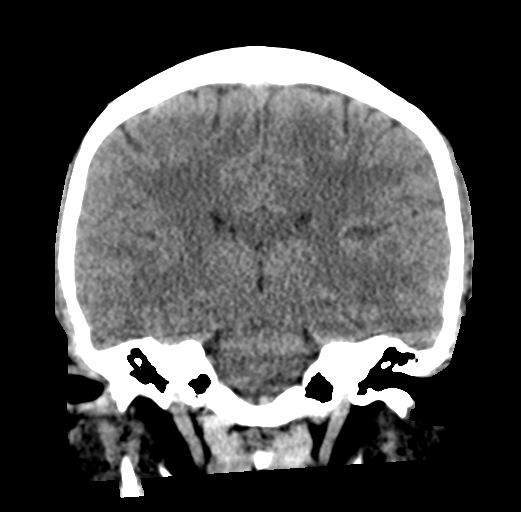

[Series 5: sagittal soft · sagittal · 0.34mm/px · 3 of 57 slices shown]
[im 19/57  brain]
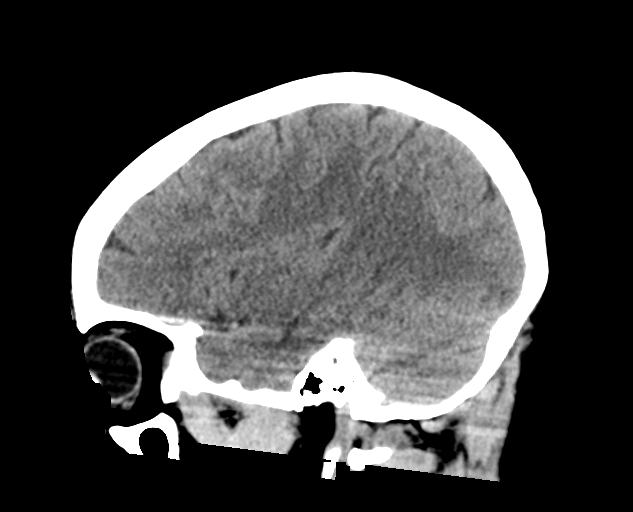
[im 29/57  brain]
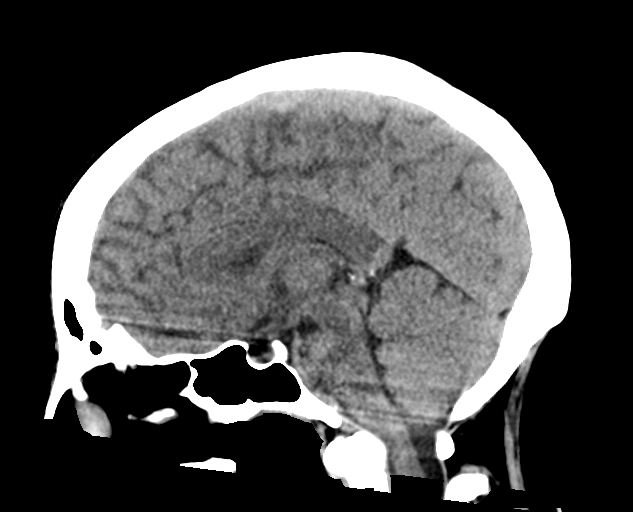
[im 38/57  brain]
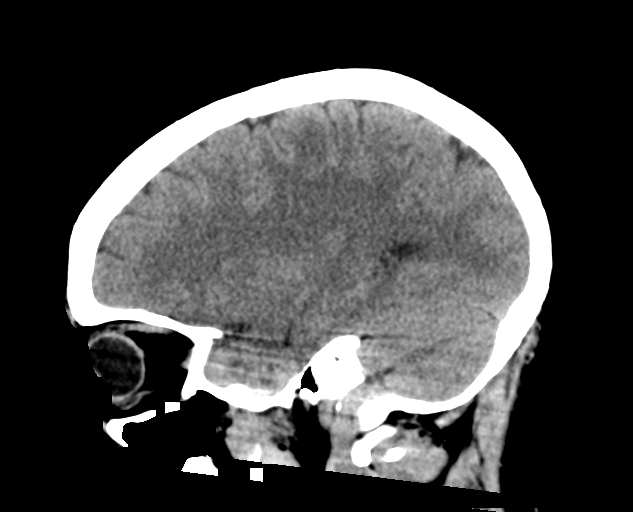

[16 of 47 positions shown; findings below may reference images not displayed]

FINDINGS: Brain: There is no acute intracranial hemorrhage, mass effect, or
edema. Gray-white differentiation is preserved. Ventricles and sulci
are normal in size and configuration. No extra-axial collection.

Vascular: No hyperdense vessel or unexpected calcification.

Skull: Unremarkable.

Sinuses/Orbits: No acute abnormality.

Other: Mastoid air cells are clear.

ASPECTS (Alberta Stroke Program Early CT Score)

- Ganglionic level infarction (caudate, lentiform nuclei, internal
capsule, insula, M1-M3 cortex): 7

- Supraganglionic infarction (M4-M6 cortex): 3

Total score (0-10 with 10 being normal): 10
IMPRESSION: There is no acute intracranial hemorrhage or evidence of acute
infarction. ASPECT score is 10.

These results were called by telephone at the time of interpretation
on 07/23/2021 at [DATE] to provider IKSUN BEAIRSTO , who verbally
acknowledged these results.
